# Patient Record
Sex: Male | Born: 1955 | Race: Black or African American | Hispanic: No | Marital: Married | Smoking: Never smoker
Health system: Southern US, Community
[De-identification: ages and names within clinical notes are randomized; demographics above are authoritative.]

## PROBLEM LIST (undated history)

## (undated) DIAGNOSIS — C801 Malignant (primary) neoplasm, unspecified: Secondary | ICD-10-CM

## (undated) DIAGNOSIS — I509 Heart failure, unspecified: Secondary | ICD-10-CM

## (undated) DIAGNOSIS — I2510 Atherosclerotic heart disease of native coronary artery without angina pectoris: Secondary | ICD-10-CM

## (undated) DIAGNOSIS — N289 Disorder of kidney and ureter, unspecified: Secondary | ICD-10-CM

## (undated) DIAGNOSIS — I10 Essential (primary) hypertension: Secondary | ICD-10-CM

## (undated) DIAGNOSIS — I255 Ischemic cardiomyopathy: Secondary | ICD-10-CM

## (undated) HISTORY — PX: HERNIA REPAIR: SHX51

## (undated) HISTORY — PX: PACEMAKER INSERTION: SHX728

---

## 2012-06-01 ENCOUNTER — Encounter: Payer: Medicare Other | Admitting: Internal Medicine

## 2012-06-01 DIAGNOSIS — G893 Neoplasm related pain (acute) (chronic): Secondary | ICD-10-CM

## 2012-06-01 DIAGNOSIS — D649 Anemia, unspecified: Secondary | ICD-10-CM

## 2012-06-01 DIAGNOSIS — C7952 Secondary malignant neoplasm of bone marrow: Secondary | ICD-10-CM

## 2012-06-01 DIAGNOSIS — C61 Malignant neoplasm of prostate: Secondary | ICD-10-CM

## 2012-06-01 DIAGNOSIS — C7951 Secondary malignant neoplasm of bone: Secondary | ICD-10-CM

## 2012-06-28 ENCOUNTER — Encounter: Payer: Medicare Other | Admitting: Internal Medicine

## 2012-06-28 DIAGNOSIS — G893 Neoplasm related pain (acute) (chronic): Secondary | ICD-10-CM

## 2012-06-28 DIAGNOSIS — C61 Malignant neoplasm of prostate: Secondary | ICD-10-CM

## 2012-06-28 DIAGNOSIS — D649 Anemia, unspecified: Secondary | ICD-10-CM

## 2012-06-28 DIAGNOSIS — I82409 Acute embolism and thrombosis of unspecified deep veins of unspecified lower extremity: Secondary | ICD-10-CM

## 2012-07-07 ENCOUNTER — Encounter: Payer: Medicare Other | Admitting: Internal Medicine

## 2012-07-07 DIAGNOSIS — C61 Malignant neoplasm of prostate: Secondary | ICD-10-CM

## 2012-07-07 DIAGNOSIS — G893 Neoplasm related pain (acute) (chronic): Secondary | ICD-10-CM

## 2012-07-07 DIAGNOSIS — Z5111 Encounter for antineoplastic chemotherapy: Secondary | ICD-10-CM

## 2012-07-14 ENCOUNTER — Encounter: Payer: Medicare Other | Admitting: Internal Medicine

## 2012-07-14 DIAGNOSIS — C61 Malignant neoplasm of prostate: Secondary | ICD-10-CM

## 2012-07-14 DIAGNOSIS — D649 Anemia, unspecified: Secondary | ICD-10-CM

## 2012-07-14 DIAGNOSIS — Z5111 Encounter for antineoplastic chemotherapy: Secondary | ICD-10-CM

## 2012-07-21 ENCOUNTER — Encounter: Payer: Medicare Other | Admitting: Internal Medicine

## 2012-07-21 DIAGNOSIS — C61 Malignant neoplasm of prostate: Secondary | ICD-10-CM

## 2012-07-21 DIAGNOSIS — C7952 Secondary malignant neoplasm of bone marrow: Secondary | ICD-10-CM

## 2012-07-21 DIAGNOSIS — C7951 Secondary malignant neoplasm of bone: Secondary | ICD-10-CM

## 2012-07-28 ENCOUNTER — Encounter: Payer: Medicare Other | Admitting: Hematology and Oncology

## 2012-07-28 DIAGNOSIS — C61 Malignant neoplasm of prostate: Secondary | ICD-10-CM

## 2012-07-28 DIAGNOSIS — Z5111 Encounter for antineoplastic chemotherapy: Secondary | ICD-10-CM

## 2012-08-04 DIAGNOSIS — C61 Malignant neoplasm of prostate: Secondary | ICD-10-CM

## 2012-08-04 DIAGNOSIS — C7952 Secondary malignant neoplasm of bone marrow: Secondary | ICD-10-CM

## 2012-08-04 DIAGNOSIS — Z5111 Encounter for antineoplastic chemotherapy: Secondary | ICD-10-CM

## 2012-08-04 DIAGNOSIS — C7951 Secondary malignant neoplasm of bone: Secondary | ICD-10-CM

## 2012-08-11 ENCOUNTER — Encounter: Payer: Medicare Other | Admitting: Internal Medicine

## 2012-08-11 DIAGNOSIS — C7951 Secondary malignant neoplasm of bone: Secondary | ICD-10-CM

## 2012-08-11 DIAGNOSIS — D649 Anemia, unspecified: Secondary | ICD-10-CM

## 2012-08-11 DIAGNOSIS — C61 Malignant neoplasm of prostate: Secondary | ICD-10-CM

## 2012-08-11 DIAGNOSIS — C7952 Secondary malignant neoplasm of bone marrow: Secondary | ICD-10-CM

## 2012-08-24 DIAGNOSIS — D649 Anemia, unspecified: Secondary | ICD-10-CM

## 2012-08-24 DIAGNOSIS — C61 Malignant neoplasm of prostate: Secondary | ICD-10-CM

## 2012-08-24 DIAGNOSIS — Z5111 Encounter for antineoplastic chemotherapy: Secondary | ICD-10-CM

## 2012-09-01 DIAGNOSIS — C7951 Secondary malignant neoplasm of bone: Secondary | ICD-10-CM

## 2012-09-01 DIAGNOSIS — Z5111 Encounter for antineoplastic chemotherapy: Secondary | ICD-10-CM

## 2012-09-01 DIAGNOSIS — C61 Malignant neoplasm of prostate: Secondary | ICD-10-CM

## 2012-09-01 DIAGNOSIS — C7952 Secondary malignant neoplasm of bone marrow: Secondary | ICD-10-CM

## 2012-09-08 ENCOUNTER — Encounter: Payer: Medicare Other | Admitting: Internal Medicine

## 2012-09-08 DIAGNOSIS — D649 Anemia, unspecified: Secondary | ICD-10-CM

## 2012-09-08 DIAGNOSIS — C7952 Secondary malignant neoplasm of bone marrow: Secondary | ICD-10-CM

## 2012-09-08 DIAGNOSIS — C7951 Secondary malignant neoplasm of bone: Secondary | ICD-10-CM

## 2012-09-08 DIAGNOSIS — Z5111 Encounter for antineoplastic chemotherapy: Secondary | ICD-10-CM

## 2012-09-08 DIAGNOSIS — C61 Malignant neoplasm of prostate: Secondary | ICD-10-CM

## 2012-09-15 DIAGNOSIS — C61 Malignant neoplasm of prostate: Secondary | ICD-10-CM

## 2012-09-15 DIAGNOSIS — Z5111 Encounter for antineoplastic chemotherapy: Secondary | ICD-10-CM

## 2012-10-03 ENCOUNTER — Encounter: Payer: Medicare Other | Admitting: Internal Medicine

## 2012-10-03 DIAGNOSIS — C61 Malignant neoplasm of prostate: Secondary | ICD-10-CM

## 2012-10-03 DIAGNOSIS — M549 Dorsalgia, unspecified: Secondary | ICD-10-CM

## 2012-10-03 DIAGNOSIS — Z5111 Encounter for antineoplastic chemotherapy: Secondary | ICD-10-CM

## 2012-11-02 DIAGNOSIS — Z5111 Encounter for antineoplastic chemotherapy: Secondary | ICD-10-CM

## 2012-11-02 DIAGNOSIS — C61 Malignant neoplasm of prostate: Secondary | ICD-10-CM

## 2012-11-16 DIAGNOSIS — C61 Malignant neoplasm of prostate: Secondary | ICD-10-CM

## 2012-11-16 DIAGNOSIS — C779 Secondary and unspecified malignant neoplasm of lymph node, unspecified: Secondary | ICD-10-CM

## 2012-11-16 DIAGNOSIS — C7952 Secondary malignant neoplasm of bone marrow: Secondary | ICD-10-CM

## 2012-11-16 DIAGNOSIS — C7951 Secondary malignant neoplasm of bone: Secondary | ICD-10-CM

## 2012-11-30 DIAGNOSIS — C7951 Secondary malignant neoplasm of bone: Secondary | ICD-10-CM

## 2012-11-30 DIAGNOSIS — C61 Malignant neoplasm of prostate: Secondary | ICD-10-CM

## 2012-11-30 DIAGNOSIS — C7952 Secondary malignant neoplasm of bone marrow: Secondary | ICD-10-CM

## 2012-11-30 DIAGNOSIS — Z5111 Encounter for antineoplastic chemotherapy: Secondary | ICD-10-CM

## 2012-12-16 ENCOUNTER — Encounter: Payer: Medicare Other | Admitting: Internal Medicine

## 2012-12-16 DIAGNOSIS — C61 Malignant neoplasm of prostate: Secondary | ICD-10-CM

## 2012-12-16 DIAGNOSIS — C7952 Secondary malignant neoplasm of bone marrow: Secondary | ICD-10-CM

## 2012-12-16 DIAGNOSIS — C7951 Secondary malignant neoplasm of bone: Secondary | ICD-10-CM

## 2012-12-16 DIAGNOSIS — D649 Anemia, unspecified: Secondary | ICD-10-CM

## 2013-01-11 ENCOUNTER — Encounter: Payer: Medicare Other | Admitting: Internal Medicine

## 2013-01-11 DIAGNOSIS — C7952 Secondary malignant neoplasm of bone marrow: Secondary | ICD-10-CM

## 2013-01-11 DIAGNOSIS — C7951 Secondary malignant neoplasm of bone: Secondary | ICD-10-CM

## 2013-01-11 DIAGNOSIS — D509 Iron deficiency anemia, unspecified: Secondary | ICD-10-CM

## 2013-01-11 DIAGNOSIS — Z5111 Encounter for antineoplastic chemotherapy: Secondary | ICD-10-CM

## 2013-01-11 DIAGNOSIS — C61 Malignant neoplasm of prostate: Secondary | ICD-10-CM

## 2013-01-19 DIAGNOSIS — D509 Iron deficiency anemia, unspecified: Secondary | ICD-10-CM

## 2013-01-19 DIAGNOSIS — E538 Deficiency of other specified B group vitamins: Secondary | ICD-10-CM

## 2013-01-19 DIAGNOSIS — C61 Malignant neoplasm of prostate: Secondary | ICD-10-CM

## 2013-01-26 DIAGNOSIS — C61 Malignant neoplasm of prostate: Secondary | ICD-10-CM

## 2013-01-26 DIAGNOSIS — C7952 Secondary malignant neoplasm of bone marrow: Secondary | ICD-10-CM

## 2013-01-26 DIAGNOSIS — C7951 Secondary malignant neoplasm of bone: Secondary | ICD-10-CM

## 2013-01-26 DIAGNOSIS — D509 Iron deficiency anemia, unspecified: Secondary | ICD-10-CM

## 2013-02-08 DIAGNOSIS — C61 Malignant neoplasm of prostate: Secondary | ICD-10-CM

## 2013-02-08 DIAGNOSIS — C7951 Secondary malignant neoplasm of bone: Secondary | ICD-10-CM

## 2013-02-08 DIAGNOSIS — C779 Secondary and unspecified malignant neoplasm of lymph node, unspecified: Secondary | ICD-10-CM

## 2013-02-08 DIAGNOSIS — C7952 Secondary malignant neoplasm of bone marrow: Secondary | ICD-10-CM

## 2013-02-08 DIAGNOSIS — Z5111 Encounter for antineoplastic chemotherapy: Secondary | ICD-10-CM

## 2013-02-16 DIAGNOSIS — E538 Deficiency of other specified B group vitamins: Secondary | ICD-10-CM

## 2013-02-16 DIAGNOSIS — C61 Malignant neoplasm of prostate: Secondary | ICD-10-CM

## 2013-02-16 DIAGNOSIS — C779 Secondary and unspecified malignant neoplasm of lymph node, unspecified: Secondary | ICD-10-CM

## 2013-03-08 DIAGNOSIS — C7952 Secondary malignant neoplasm of bone marrow: Secondary | ICD-10-CM

## 2013-03-08 DIAGNOSIS — C7951 Secondary malignant neoplasm of bone: Secondary | ICD-10-CM

## 2013-03-08 DIAGNOSIS — Z5111 Encounter for antineoplastic chemotherapy: Secondary | ICD-10-CM

## 2013-03-08 DIAGNOSIS — C61 Malignant neoplasm of prostate: Secondary | ICD-10-CM

## 2013-03-16 DIAGNOSIS — C7952 Secondary malignant neoplasm of bone marrow: Secondary | ICD-10-CM

## 2013-03-16 DIAGNOSIS — E538 Deficiency of other specified B group vitamins: Secondary | ICD-10-CM

## 2013-03-16 DIAGNOSIS — C61 Malignant neoplasm of prostate: Secondary | ICD-10-CM

## 2013-03-16 DIAGNOSIS — C7951 Secondary malignant neoplasm of bone: Secondary | ICD-10-CM

## 2013-03-27 DIAGNOSIS — Z5111 Encounter for antineoplastic chemotherapy: Secondary | ICD-10-CM

## 2013-03-27 DIAGNOSIS — C7952 Secondary malignant neoplasm of bone marrow: Secondary | ICD-10-CM

## 2013-03-27 DIAGNOSIS — C61 Malignant neoplasm of prostate: Secondary | ICD-10-CM

## 2013-03-27 DIAGNOSIS — C7951 Secondary malignant neoplasm of bone: Secondary | ICD-10-CM

## 2013-04-05 DIAGNOSIS — C7951 Secondary malignant neoplasm of bone: Secondary | ICD-10-CM

## 2013-04-05 DIAGNOSIS — Z5111 Encounter for antineoplastic chemotherapy: Secondary | ICD-10-CM

## 2013-04-05 DIAGNOSIS — C779 Secondary and unspecified malignant neoplasm of lymph node, unspecified: Secondary | ICD-10-CM

## 2013-04-05 DIAGNOSIS — C7952 Secondary malignant neoplasm of bone marrow: Secondary | ICD-10-CM

## 2013-04-05 DIAGNOSIS — C61 Malignant neoplasm of prostate: Secondary | ICD-10-CM

## 2013-04-12 DIAGNOSIS — E538 Deficiency of other specified B group vitamins: Secondary | ICD-10-CM

## 2013-04-12 DIAGNOSIS — C7952 Secondary malignant neoplasm of bone marrow: Secondary | ICD-10-CM

## 2013-04-12 DIAGNOSIS — C61 Malignant neoplasm of prostate: Secondary | ICD-10-CM

## 2013-04-12 DIAGNOSIS — Z5111 Encounter for antineoplastic chemotherapy: Secondary | ICD-10-CM

## 2013-04-12 DIAGNOSIS — C7951 Secondary malignant neoplasm of bone: Secondary | ICD-10-CM

## 2013-04-13 ENCOUNTER — Encounter: Payer: Self-pay | Admitting: Internal Medicine

## 2013-04-19 DIAGNOSIS — C7951 Secondary malignant neoplasm of bone: Secondary | ICD-10-CM

## 2013-04-19 DIAGNOSIS — Z5111 Encounter for antineoplastic chemotherapy: Secondary | ICD-10-CM

## 2013-04-19 DIAGNOSIS — C7952 Secondary malignant neoplasm of bone marrow: Secondary | ICD-10-CM

## 2013-04-19 DIAGNOSIS — C61 Malignant neoplasm of prostate: Secondary | ICD-10-CM

## 2013-04-26 DIAGNOSIS — Z5111 Encounter for antineoplastic chemotherapy: Secondary | ICD-10-CM

## 2013-04-26 DIAGNOSIS — C7951 Secondary malignant neoplasm of bone: Secondary | ICD-10-CM

## 2013-04-26 DIAGNOSIS — C61 Malignant neoplasm of prostate: Secondary | ICD-10-CM

## 2013-04-26 DIAGNOSIS — C7952 Secondary malignant neoplasm of bone marrow: Secondary | ICD-10-CM

## 2013-05-03 DIAGNOSIS — C7951 Secondary malignant neoplasm of bone: Secondary | ICD-10-CM

## 2013-05-03 DIAGNOSIS — Z5111 Encounter for antineoplastic chemotherapy: Secondary | ICD-10-CM

## 2013-05-03 DIAGNOSIS — C61 Malignant neoplasm of prostate: Secondary | ICD-10-CM

## 2013-05-03 DIAGNOSIS — C7952 Secondary malignant neoplasm of bone marrow: Secondary | ICD-10-CM

## 2013-05-11 DIAGNOSIS — C7952 Secondary malignant neoplasm of bone marrow: Secondary | ICD-10-CM

## 2013-05-11 DIAGNOSIS — C7951 Secondary malignant neoplasm of bone: Secondary | ICD-10-CM

## 2013-05-11 DIAGNOSIS — C61 Malignant neoplasm of prostate: Secondary | ICD-10-CM

## 2013-05-11 DIAGNOSIS — E538 Deficiency of other specified B group vitamins: Secondary | ICD-10-CM

## 2013-05-15 ENCOUNTER — Encounter: Payer: Self-pay | Admitting: Internal Medicine

## 2013-05-15 DIAGNOSIS — C772 Secondary and unspecified malignant neoplasm of intra-abdominal lymph nodes: Secondary | ICD-10-CM

## 2013-05-15 DIAGNOSIS — C7952 Secondary malignant neoplasm of bone marrow: Secondary | ICD-10-CM

## 2013-05-15 DIAGNOSIS — C61 Malignant neoplasm of prostate: Secondary | ICD-10-CM

## 2013-05-15 DIAGNOSIS — C7951 Secondary malignant neoplasm of bone: Secondary | ICD-10-CM

## 2013-05-31 DIAGNOSIS — C7952 Secondary malignant neoplasm of bone marrow: Secondary | ICD-10-CM

## 2013-05-31 DIAGNOSIS — Z5111 Encounter for antineoplastic chemotherapy: Secondary | ICD-10-CM

## 2013-05-31 DIAGNOSIS — C7951 Secondary malignant neoplasm of bone: Secondary | ICD-10-CM

## 2013-05-31 DIAGNOSIS — C61 Malignant neoplasm of prostate: Secondary | ICD-10-CM

## 2013-06-05 ENCOUNTER — Encounter (HOSPITAL_COMMUNITY): Payer: Self-pay | Admitting: Emergency Medicine

## 2013-06-05 ENCOUNTER — Emergency Department (HOSPITAL_COMMUNITY)
Admission: EM | Admit: 2013-06-05 | Discharge: 2013-06-05 | Disposition: A | Discharge status: Home / Self Care | Payer: Medicaid - Out of State | Attending: Emergency Medicine | Admitting: Emergency Medicine

## 2013-06-05 DIAGNOSIS — I10 Essential (primary) hypertension: Secondary | ICD-10-CM | POA: Insufficient documentation

## 2013-06-05 DIAGNOSIS — Z79899 Other long term (current) drug therapy: Secondary | ICD-10-CM | POA: Insufficient documentation

## 2013-06-05 DIAGNOSIS — R609 Edema, unspecified: Secondary | ICD-10-CM | POA: Insufficient documentation

## 2013-06-05 DIAGNOSIS — R300 Dysuria: Secondary | ICD-10-CM | POA: Insufficient documentation

## 2013-06-05 DIAGNOSIS — Z7982 Long term (current) use of aspirin: Secondary | ICD-10-CM | POA: Insufficient documentation

## 2013-06-05 DIAGNOSIS — N4889 Other specified disorders of penis: Secondary | ICD-10-CM

## 2013-06-05 DIAGNOSIS — Z859 Personal history of malignant neoplasm, unspecified: Secondary | ICD-10-CM | POA: Insufficient documentation

## 2013-06-05 HISTORY — DX: Essential (primary) hypertension: I10

## 2013-06-05 HISTORY — DX: Malignant (primary) neoplasm, unspecified: C80.1

## 2013-06-05 LAB — COMPREHENSIVE METABOLIC PANEL
BUN: 16 mg/dL (ref 6–23)
CO2: 27 mEq/L (ref 19–32)
Calcium: 9.1 mg/dL (ref 8.4–10.5)
Chloride: 107 mEq/L (ref 96–112)
Creatinine, Ser: 0.8 mg/dL (ref 0.50–1.35)
GFR calc non Af Amer: >90 mL/min (ref >90)
Total Bilirubin: 0.6 mg/dL (ref 0.3–1.2)

## 2013-06-05 LAB — CBC WITH DIFFERENTIAL/PLATELET
Basophils Relative: 0 % (ref 0–1)
Eosinophils Relative: 3 % (ref 0–5)
HCT: 34.6 % — ABNORMAL LOW (ref 39.0–52.0)
Hemoglobin: 10.9 g/dL — ABNORMAL LOW (ref 13.0–17.0)
MCHC: 31.5 g/dL (ref 30.0–36.0)
MCV: 84.8 fL (ref 78.0–100.0)
Monocytes Absolute: 0.5 10*3/uL (ref 0.1–1.0)
Monocytes Relative: 11 % (ref 3–12)
Neutro Abs: 2.8 10*3/uL (ref 1.7–7.7)

## 2013-06-05 LAB — URINALYSIS, ROUTINE W REFLEX MICROSCOPIC
Leukocytes, UA: NEGATIVE
Nitrite: NEGATIVE
Protein, ur: NEGATIVE mg/dL
Urobilinogen, UA: 0.2 mg/dL (ref 0.0–1.0)

## 2013-06-05 NOTE — ED Notes (Signed)
Pt c/o swelling in legs and scrotum x several days; pt sts hx of same

## 2013-06-05 NOTE — Progress Notes (Signed)
*  Preliminary Results* Left lower extremity venous duplex completed. Left lower extremity is negative for deep vein thrombosis. There is no evidence of left Baker's cyst. Preliminary results discussed with Dr.Pickering.  06/05/2013 3:55 PM Gertie Fey, RDMS, RDCS

## 2013-06-05 NOTE — ED Provider Notes (Signed)
History     CSN: [id number]  Arrival date & time 06/05/13  1244   First MD Initiated Contact with Patient 06/05/13 1352      Chief Complaint  Patient presents with  . Leg Swelling    (Consider location/radiation/quality/duration/timing/severity/associated sxs/prior treatment) The history is provided by the patient.   patient states he has had swelling of his left thigh for the last 3 weeks and his penis for the last 2 weeks. He states he saw the hospital in [place] and they told nothing was wrong. He states he has been having difficulty urinating. He states that his left thigh is swollen. He denies difficulty breathing or chest pain. No fevers. No trauma. He states he has cancer and is on treatment for it in again. He states he has "bone cancer"  Past Medical History  Diagnosis Date  . Cancer   . Hypertension     History reviewed. No pertinent past surgical history.  History reviewed. No pertinent family history.  History  Substance Use Topics  . Smoking status: Never Smoker   . Smokeless tobacco: Not on file  . Alcohol Use: No      Review of Systems  Constitutional: Negative for activity change and appetite change.  HENT: Negative for neck stiffness.   Eyes: Negative for pain.  Respiratory: Negative for chest tightness and shortness of breath.   Cardiovascular: Positive for leg swelling. Negative for chest pain.  Gastrointestinal: Negative for nausea, vomiting, abdominal pain and diarrhea.  Genitourinary: Negative for flank pain.  Musculoskeletal: Negative for back pain.  Skin: Negative for rash.  Neurological: Negative for weakness, numbness and headaches.  Psychiatric/Behavioral: Negative for behavioral problems.    Allergies  Review of patient's allergies indicates no known allergies.  Home Medications   Current Outpatient Rx  Name  Route  Sig  Dispense  Refill  . aspirin EC 81 MG tablet   Oral   Take 81 mg by mouth daily.         . Calcium  Carbonate-Vitamin D (CALCIUM + D PO)   Oral   Take 1 tablet by mouth daily. 600 mg/ 800 units         . fentaNYL (DURAGESIC - DOSED MCG/HR) 100 MCG/HR   Transdermal   Place 1 patch onto the skin every 3 (three) days.         . furosemide (LASIX) 40 MG tablet   Oral   Take 40 mg by mouth daily.         Marland Kitchen lisinopril (PRINIVIL,ZESTRIL) 5 MG tablet   Oral   Take 5 mg by mouth daily.         Marland Kitchen LORazepam (ATIVAN) 2 MG tablet   Oral   Take 2 mg by mouth at bedtime as needed for anxiety (and sleep).         . metoprolol (LOPRESSOR) 50 MG tablet   Oral   Take 50 mg by mouth 2 (two) times daily.         . Oxycodone HCl 10 MG TABS   Oral   Take 20 mg by mouth every 3 (three) hours as needed (for pain).         . potassium chloride (MICRO-K) 10 MEQ CR capsule   Oral   Take 10 mEq by mouth daily.         . predniSONE (DELTASONE) 10 MG tablet   Oral   Take 10 mg by mouth daily.         Marland Kitchen  senna-docusate (SENOKOT-S) 8.6-50 MG per tablet   Oral   Take 1 tablet by mouth daily as needed for constipation.         . simvastatin (ZOCOR) 40 MG tablet   Oral   Take 40 mg by mouth every evening.         . traZODone (DESYREL) 50 MG tablet   Oral   Take 50 mg by mouth at bedtime as needed for sleep.           BP 142/79  Pulse 66  Temp(Src) 98 F (36.7 C) (Oral)  Resp 19  SpO2 99%  Physical Exam  Nursing note and vitals reviewed. Constitutional: He is oriented to person, place, and time. He appears well-developed and well-nourished.  HENT:  Head: Normocephalic and atraumatic.  Eyes: EOM are normal. Pupils are equal, round, and reactive to light.  Neck: Normal range of motion. Neck supple.  Cardiovascular: Normal rate, regular rhythm and normal heart sounds.   No murmur heard. Pulmonary/Chest: Effort normal and breath sounds normal.  Abdominal: Soft. Bowel sounds are normal. He exhibits no distension and no mass. There is no tenderness. There is no  rebound and no guarding.  Genitourinary:  Patient with edema of the head and shaft of his penis. No erethema.  Musculoskeletal: Normal range of motion. He exhibits edema.  Mild peripheral edema. Some swelling of left thigh.  Neurological: He is alert and oriented to person, place, and time. No cranial nerve deficit.  Skin: Skin is warm and dry.  Psychiatric: He has a normal mood and affect.    ED Course  Procedures (including critical care time)  Labs Reviewed  CBC WITH DIFFERENTIAL - Abnormal; Notable for the following:    RBC 4.08 (*)    Hemoglobin 10.9 (*)    HCT 34.6 (*)    RDW 16.9 (*)    All other components within normal limits  COMPREHENSIVE METABOLIC PANEL - Abnormal; Notable for the following:    Glucose, Bld 104 (*)    All other components within normal limits  URINALYSIS, ROUTINE W REFLEX MICROSCOPIC   No results found.   1. Edema of penis       MDM  Patient with edema penis and possible swelling of left thigh. Lab work is reassuring and negative Doppler. Patient won't be given extra dose of Lasix at home for the next 2 days. He'll follow with urology. He does not appear to be infected. He has been able to urinate        [employer]. Rubin Payor, MD 06/05/13 1616

## 2013-06-12 ENCOUNTER — Encounter: Payer: Medicaid - Out of State | Admitting: Internal Medicine

## 2013-06-12 DIAGNOSIS — C7952 Secondary malignant neoplasm of bone marrow: Secondary | ICD-10-CM

## 2013-06-12 DIAGNOSIS — E538 Deficiency of other specified B group vitamins: Secondary | ICD-10-CM

## 2013-06-12 DIAGNOSIS — C61 Malignant neoplasm of prostate: Secondary | ICD-10-CM

## 2013-06-12 DIAGNOSIS — C772 Secondary and unspecified malignant neoplasm of intra-abdominal lymph nodes: Secondary | ICD-10-CM

## 2013-06-12 DIAGNOSIS — C7951 Secondary malignant neoplasm of bone: Secondary | ICD-10-CM

## 2013-06-29 DIAGNOSIS — C61 Malignant neoplasm of prostate: Secondary | ICD-10-CM

## 2013-06-29 DIAGNOSIS — C7951 Secondary malignant neoplasm of bone: Secondary | ICD-10-CM

## 2013-06-29 DIAGNOSIS — Z5111 Encounter for antineoplastic chemotherapy: Secondary | ICD-10-CM

## 2013-06-29 DIAGNOSIS — C7952 Secondary malignant neoplasm of bone marrow: Secondary | ICD-10-CM

## 2013-07-10 DIAGNOSIS — C61 Malignant neoplasm of prostate: Secondary | ICD-10-CM

## 2013-07-10 DIAGNOSIS — C7951 Secondary malignant neoplasm of bone: Secondary | ICD-10-CM

## 2013-07-10 DIAGNOSIS — C7952 Secondary malignant neoplasm of bone marrow: Secondary | ICD-10-CM

## 2013-08-07 DIAGNOSIS — C61 Malignant neoplasm of prostate: Secondary | ICD-10-CM

## 2013-08-07 DIAGNOSIS — C7952 Secondary malignant neoplasm of bone marrow: Secondary | ICD-10-CM

## 2013-08-07 DIAGNOSIS — C7951 Secondary malignant neoplasm of bone: Secondary | ICD-10-CM

## 2013-08-14 DIAGNOSIS — R109 Unspecified abdominal pain: Secondary | ICD-10-CM

## 2013-08-21 DIAGNOSIS — C61 Malignant neoplasm of prostate: Secondary | ICD-10-CM

## 2013-08-21 DIAGNOSIS — C7952 Secondary malignant neoplasm of bone marrow: Secondary | ICD-10-CM

## 2013-08-21 DIAGNOSIS — F29 Unspecified psychosis not due to a substance or known physiological condition: Secondary | ICD-10-CM

## 2013-08-21 DIAGNOSIS — Z5111 Encounter for antineoplastic chemotherapy: Secondary | ICD-10-CM

## 2013-08-21 DIAGNOSIS — C7951 Secondary malignant neoplasm of bone: Secondary | ICD-10-CM

## 2013-08-21 DIAGNOSIS — R509 Fever, unspecified: Secondary | ICD-10-CM

## 2013-08-25 DIAGNOSIS — D649 Anemia, unspecified: Secondary | ICD-10-CM

## 2013-08-25 DIAGNOSIS — C61 Malignant neoplasm of prostate: Secondary | ICD-10-CM

## 2013-09-01 DIAGNOSIS — Z5111 Encounter for antineoplastic chemotherapy: Secondary | ICD-10-CM

## 2013-09-01 DIAGNOSIS — C61 Malignant neoplasm of prostate: Secondary | ICD-10-CM

## 2013-09-01 DIAGNOSIS — D649 Anemia, unspecified: Secondary | ICD-10-CM

## 2013-09-01 DIAGNOSIS — E538 Deficiency of other specified B group vitamins: Secondary | ICD-10-CM

## 2013-09-04 DIAGNOSIS — C7951 Secondary malignant neoplasm of bone: Secondary | ICD-10-CM

## 2013-09-04 DIAGNOSIS — C7952 Secondary malignant neoplasm of bone marrow: Secondary | ICD-10-CM

## 2013-09-04 DIAGNOSIS — C61 Malignant neoplasm of prostate: Secondary | ICD-10-CM

## 2013-09-04 DIAGNOSIS — D702 Other drug-induced agranulocytosis: Secondary | ICD-10-CM

## 2013-09-08 DIAGNOSIS — C61 Malignant neoplasm of prostate: Secondary | ICD-10-CM

## 2013-09-08 DIAGNOSIS — D649 Anemia, unspecified: Secondary | ICD-10-CM

## 2013-09-14 DIAGNOSIS — D630 Anemia in neoplastic disease: Secondary | ICD-10-CM

## 2013-09-14 DIAGNOSIS — C787 Secondary malignant neoplasm of liver and intrahepatic bile duct: Secondary | ICD-10-CM

## 2013-09-14 DIAGNOSIS — C7952 Secondary malignant neoplasm of bone marrow: Secondary | ICD-10-CM

## 2013-09-14 DIAGNOSIS — C61 Malignant neoplasm of prostate: Secondary | ICD-10-CM

## 2013-09-14 DIAGNOSIS — C7951 Secondary malignant neoplasm of bone: Secondary | ICD-10-CM

## 2013-09-21 DIAGNOSIS — Z5111 Encounter for antineoplastic chemotherapy: Secondary | ICD-10-CM

## 2013-09-21 DIAGNOSIS — D649 Anemia, unspecified: Secondary | ICD-10-CM

## 2013-09-21 DIAGNOSIS — C7952 Secondary malignant neoplasm of bone marrow: Secondary | ICD-10-CM

## 2013-09-21 DIAGNOSIS — C7951 Secondary malignant neoplasm of bone: Secondary | ICD-10-CM

## 2013-09-21 DIAGNOSIS — C61 Malignant neoplasm of prostate: Secondary | ICD-10-CM

## 2013-09-22 DIAGNOSIS — T451X5A Adverse effect of antineoplastic and immunosuppressive drugs, initial encounter: Secondary | ICD-10-CM

## 2013-09-22 DIAGNOSIS — D702 Other drug-induced agranulocytosis: Secondary | ICD-10-CM

## 2013-09-28 DIAGNOSIS — D6481 Anemia due to antineoplastic chemotherapy: Secondary | ICD-10-CM

## 2013-09-28 DIAGNOSIS — T451X5A Adverse effect of antineoplastic and immunosuppressive drugs, initial encounter: Secondary | ICD-10-CM

## 2013-10-05 DIAGNOSIS — D649 Anemia, unspecified: Secondary | ICD-10-CM

## 2013-10-12 DIAGNOSIS — C7952 Secondary malignant neoplasm of bone marrow: Secondary | ICD-10-CM

## 2013-10-12 DIAGNOSIS — Z5111 Encounter for antineoplastic chemotherapy: Secondary | ICD-10-CM

## 2013-10-12 DIAGNOSIS — C7951 Secondary malignant neoplasm of bone: Secondary | ICD-10-CM

## 2013-10-12 DIAGNOSIS — C61 Malignant neoplasm of prostate: Secondary | ICD-10-CM

## 2013-10-12 DIAGNOSIS — D649 Anemia, unspecified: Secondary | ICD-10-CM

## 2013-10-12 DIAGNOSIS — C787 Secondary malignant neoplasm of liver and intrahepatic bile duct: Secondary | ICD-10-CM

## 2013-11-02 DIAGNOSIS — Z5111 Encounter for antineoplastic chemotherapy: Secondary | ICD-10-CM

## 2013-11-02 DIAGNOSIS — C7951 Secondary malignant neoplasm of bone: Secondary | ICD-10-CM

## 2013-11-02 DIAGNOSIS — C7952 Secondary malignant neoplasm of bone marrow: Secondary | ICD-10-CM

## 2013-11-02 DIAGNOSIS — C61 Malignant neoplasm of prostate: Secondary | ICD-10-CM

## 2013-11-02 DIAGNOSIS — C787 Secondary malignant neoplasm of liver and intrahepatic bile duct: Secondary | ICD-10-CM

## 2013-11-09 DIAGNOSIS — R609 Edema, unspecified: Secondary | ICD-10-CM

## 2013-11-09 DIAGNOSIS — C787 Secondary malignant neoplasm of liver and intrahepatic bile duct: Secondary | ICD-10-CM

## 2013-11-09 DIAGNOSIS — R339 Retention of urine, unspecified: Secondary | ICD-10-CM

## 2013-11-09 DIAGNOSIS — C7951 Secondary malignant neoplasm of bone: Secondary | ICD-10-CM

## 2013-11-09 DIAGNOSIS — C61 Malignant neoplasm of prostate: Secondary | ICD-10-CM

## 2013-11-09 DIAGNOSIS — C7952 Secondary malignant neoplasm of bone marrow: Secondary | ICD-10-CM

## 2013-11-21 DIAGNOSIS — C61 Malignant neoplasm of prostate: Secondary | ICD-10-CM

## 2013-11-21 DIAGNOSIS — C7952 Secondary malignant neoplasm of bone marrow: Secondary | ICD-10-CM

## 2013-11-21 DIAGNOSIS — C7951 Secondary malignant neoplasm of bone: Secondary | ICD-10-CM

## 2013-11-21 DIAGNOSIS — Z5111 Encounter for antineoplastic chemotherapy: Secondary | ICD-10-CM

## 2013-11-22 DIAGNOSIS — D702 Other drug-induced agranulocytosis: Secondary | ICD-10-CM

## 2014-04-23 ENCOUNTER — Other Ambulatory Visit: Payer: Self-pay | Admitting: *Deleted

## 2014-04-23 DIAGNOSIS — R609 Edema, unspecified: Secondary | ICD-10-CM

## 2014-05-09 ENCOUNTER — Encounter: Payer: Self-pay | Admitting: Vascular Surgery

## 2014-05-10 ENCOUNTER — Encounter: Payer: Self-pay | Admitting: Vascular Surgery

## 2014-05-10 ENCOUNTER — Ambulatory Visit (INDEPENDENT_AMBULATORY_CARE_PROVIDER_SITE_OTHER): Payer: Medicare Other | Admitting: Vascular Surgery

## 2014-05-10 ENCOUNTER — Ambulatory Visit (HOSPITAL_COMMUNITY)
Admission: RE | Admit: 2014-05-10 | Discharge: 2014-05-10 | Disposition: A | Discharge status: Home / Self Care | Payer: Medicare Other | Source: Ambulatory Visit | Attending: Vascular Surgery | Admitting: Vascular Surgery

## 2014-05-10 VITALS — BP 109/76 | HR 84 | Resp 16 | Ht 70.0 in | Wt 217.0 lb

## 2014-05-10 DIAGNOSIS — R609 Edema, unspecified: Secondary | ICD-10-CM | POA: Diagnosis not present

## 2014-05-10 DIAGNOSIS — M25569 Pain in unspecified knee: Secondary | ICD-10-CM | POA: Diagnosis not present

## 2014-05-10 NOTE — Progress Notes (Signed)
[facility] [place] HISTORY AND PHYSICAL   History of Present Illness:  Patient is a 58 y.o. year old male who presents for evaluation of bilateral lower extremity edema left leg greater than right. The patient has known prostate cancer. 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 He has had persistent swelling of both lower extremities for approximately 8 months. The left leg is worse than the right. He has also had edema of the penis. He was sent here for further evaluation of the swelling. Review of his medical records is remarkable for a CT scan performed November of 2014 which shows diffuse hepatic metastatic deposits with multiple small retroperitoneal lymph nodes in presacral edema.  The patient denies any trauma to his lower extremities. He denies prior history of DVT. To review his medical record she has had multiple prior venous duplex scans which showed no evidence of DVT. Other medical problems include hypertension which is controlled.  Past Medical History  Diagnosis Date  . Cancer   . Hypertension     Past Surgical History  Procedure Laterality Date  . Hernia repair    . Pacemaker insertion      Social History History  Substance Use Topics  . Smoking status: Never Smoker   . Smokeless tobacco: Never Used  . Alcohol Use: No    Family History Family History  Problem Relation Age of Onset  . Heart disease Mother     Allergies  No Known Allergies   Current Outpatient Prescriptions  Medication Sig Dispense Refill  . aspirin EC 81 MG tablet Take 81 mg by mouth daily.      . Calcium Carbonate-Vitamin D (CALCIUM + D PO) Take 1 tablet by mouth daily. 600 mg/ 800 units      . fentaNYL (DURAGESIC - DOSED MCG/HR) 100 MCG/HR Place 1 patch onto the skin every 3 (three) days.      . furosemide (LASIX) 40 MG tablet Take 40 mg by mouth daily.      Marland Kitchen lisinopril (PRINIVIL,ZESTRIL) 5 MG tablet Take 5 mg by mouth daily.      Marland Kitchen LORazepam (ATIVAN) 2 MG  tablet Take 2 mg by mouth at bedtime as needed for anxiety (and sleep).      . metoprolol (LOPRESSOR) 50 MG tablet Take 50 mg by mouth 2 (two) times daily.      . Oxycodone HCl 10 MG TABS Take 20 mg by mouth every 3 (three) hours as needed (for pain).      . potassium chloride (MICRO-K) 10 MEQ CR capsule Take 10 mEq by mouth daily.      Marland Kitchen senna-docusate (SENOKOT-S) 8.6-50 MG per tablet Take 1 tablet by mouth daily as needed for constipation.      . simvastatin (ZOCOR) 40 MG tablet Take 40 mg by mouth every evening.      . traZODone (DESYREL) 50 MG tablet Take 50 mg by mouth at bedtime as needed for sleep.      . predniSONE (DELTASONE) 10 MG tablet Take 10 mg by mouth daily.       No current facility-administered medications for this visit.    ROS:   General:  No weight loss, Fever, chills  HEENT: No recent headaches, no nasal bleeding, no visual changes, no sore throat  Neurologic: No dizziness, blackouts, seizures. No recent symptoms of stroke or mini- stroke. No recent episodes of slurred speech, or temporary blindness.  Cardiac: No recent episodes of chest pain/pressure, no  shortness of breath at rest.  No shortness of breath with exertion.  Denies history of atrial fibrillation or irregular heartbeat  Vascular: No history of rest pain in feet.  No history of claudication.  No history of non-healing ulcer, No history of DVT   Pulmonary: No home oxygen, no productive cough, no hemoptysis,  No asthma or wheezing  Musculoskeletal:  [ ]  Arthritis, [ ]  Low back pain,  [ ]  Joint pain  Hematologic:No history of hypercoagulable state.  No history of easy bleeding.  No history of anemia  Gastrointestinal: No hematochezia or melena,  No gastroesophageal reflux, no trouble swallowing  Urinary: [ ]  chronic Kidney disease, [ ]  on HD - [ ]  MWF or [ ]  TTHS, [ ]  Burning with urination, [ ]  Frequent urination, [ ]  Difficulty urinating;   Skin: No rashes  Psychological: No history of anxiety,   No history of depression   Physical Examination  Filed Vitals:   05/10/14 1538  BP: 109/76  Pulse: 84  Resp: 16  Height: 5\' 10"  (1.778 m)  Weight: 217 lb (98.431 kg)    Body mass index is 31.14 kg/(m^2).  General:  Alert and oriented, no acute distress HEENT: Normal Neck: No bruit or JVD Pulmonary: Clear to auscultation bilaterally Cardiac: Regular Rate and Rhythm without murmur Abdomen: Soft, non-tender, non-distended, no mass, bilateral inguinal scars from prior hernia repair no abdominal wall edema  Skin: No rash Extremity Pulses:  2+ radial, brachial, femoral, absent dorsalis pedis, posterior tibial pulses bilaterally Musculoskeletal: No deformity, diffuse thickened woody edema left lower extremity extending from the thigh to the foot similar findings right leg although not as prominent no obvious varicosities  Neurologic: Upper and lower extremity motor 5/5 and symmetric  DATA:  Patient had a venous duplex exam today which showed no evidence of DVT, no superficial venous thrombosis and no evidence of venous reflux   ASSESSMENT:  Bilateral lower extremity lymphedema left leg greater than right most likely secondary to lymphatic obstruction from his prostate cancer   PLAN:  The patient previously had worn a panty hose style compression garment. I have encouraged him to start wearing this again to improve his symptoms. If his symptoms worsen over time he could be referred to physical therapy for instruction and manual compression of his lymphedema. He will followup on as-needed basis.  Ruta Hinds, MD [facility] [place] Office: [phone] Pager: [phone]

## 2014-09-24 ENCOUNTER — Inpatient Hospital Stay (HOSPITAL_COMMUNITY)
Admission: AD | Admit: 2014-09-24 | Discharge: 2014-10-28 | DRG: 286 | Disposition: E | Payer: Medicare Other | Source: Other Acute Inpatient Hospital | Attending: Internal Medicine | Admitting: Internal Medicine

## 2014-09-24 ENCOUNTER — Encounter (HOSPITAL_COMMUNITY): Payer: Self-pay | Admitting: Physician Assistant

## 2014-09-24 ENCOUNTER — Inpatient Hospital Stay (HOSPITAL_COMMUNITY): Payer: Medicare Other

## 2014-09-24 DIAGNOSIS — C7B8 Other secondary neuroendocrine tumors: Secondary | ICD-10-CM | POA: Diagnosis not present

## 2014-09-24 DIAGNOSIS — E874 Mixed disorder of acid-base balance: Secondary | ICD-10-CM | POA: Diagnosis present

## 2014-09-24 DIAGNOSIS — J9600 Acute respiratory failure, unspecified whether with hypoxia or hypercapnia: Secondary | ICD-10-CM | POA: Diagnosis not present

## 2014-09-24 DIAGNOSIS — E875 Hyperkalemia: Secondary | ICD-10-CM | POA: Diagnosis not present

## 2014-09-24 DIAGNOSIS — C7951 Secondary malignant neoplasm of bone: Secondary | ICD-10-CM | POA: Diagnosis present

## 2014-09-24 DIAGNOSIS — R34 Anuria and oliguria: Secondary | ICD-10-CM | POA: Diagnosis not present

## 2014-09-24 DIAGNOSIS — Z95810 Presence of automatic (implantable) cardiac defibrillator: Secondary | ICD-10-CM

## 2014-09-24 DIAGNOSIS — IMO0002 Reserved for concepts with insufficient information to code with codable children: Secondary | ICD-10-CM | POA: Diagnosis not present

## 2014-09-24 DIAGNOSIS — N390 Urinary tract infection, site not specified: Secondary | ICD-10-CM | POA: Diagnosis not present

## 2014-09-24 DIAGNOSIS — I4721 Torsades de pointes: Secondary | ICD-10-CM

## 2014-09-24 DIAGNOSIS — Z8546 Personal history of malignant neoplasm of prostate: Secondary | ICD-10-CM | POA: Diagnosis not present

## 2014-09-24 DIAGNOSIS — I129 Hypertensive chronic kidney disease with stage 1 through stage 4 chronic kidney disease, or unspecified chronic kidney disease: Secondary | ICD-10-CM | POA: Diagnosis not present

## 2014-09-24 DIAGNOSIS — I2589 Other forms of chronic ischemic heart disease: Secondary | ICD-10-CM

## 2014-09-24 DIAGNOSIS — N189 Chronic kidney disease, unspecified: Secondary | ICD-10-CM | POA: Diagnosis present

## 2014-09-24 DIAGNOSIS — D638 Anemia in other chronic diseases classified elsewhere: Secondary | ICD-10-CM | POA: Diagnosis not present

## 2014-09-24 DIAGNOSIS — I2510 Atherosclerotic heart disease of native coronary artery without angina pectoris: Secondary | ICD-10-CM | POA: Diagnosis not present

## 2014-09-24 DIAGNOSIS — J9811 Atelectasis: Secondary | ICD-10-CM | POA: Diagnosis not present

## 2014-09-24 DIAGNOSIS — Z66 Do not resuscitate: Secondary | ICD-10-CM | POA: Diagnosis not present

## 2014-09-24 DIAGNOSIS — J9819 Other pulmonary collapse: Secondary | ICD-10-CM | POA: Diagnosis not present

## 2014-09-24 DIAGNOSIS — R570 Cardiogenic shock: Secondary | ICD-10-CM | POA: Diagnosis present

## 2014-09-24 DIAGNOSIS — Z7952 Long term (current) use of systemic steroids: Secondary | ICD-10-CM

## 2014-09-24 DIAGNOSIS — K5900 Constipation, unspecified: Secondary | ICD-10-CM | POA: Diagnosis not present

## 2014-09-24 DIAGNOSIS — I4729 Other ventricular tachycardia: Secondary | ICD-10-CM | POA: Diagnosis present

## 2014-09-24 DIAGNOSIS — Q231 Congenital insufficiency of aortic valve: Secondary | ICD-10-CM

## 2014-09-24 DIAGNOSIS — Z7982 Long term (current) use of aspirin: Secondary | ICD-10-CM | POA: Diagnosis not present

## 2014-09-24 DIAGNOSIS — R739 Hyperglycemia, unspecified: Secondary | ICD-10-CM | POA: Diagnosis not present

## 2014-09-24 DIAGNOSIS — I252 Old myocardial infarction: Secondary | ICD-10-CM | POA: Diagnosis not present

## 2014-09-24 DIAGNOSIS — Z8249 Family history of ischemic heart disease and other diseases of the circulatory system: Secondary | ICD-10-CM

## 2014-09-24 DIAGNOSIS — I5023 Acute on chronic systolic (congestive) heart failure: Secondary | ICD-10-CM

## 2014-09-24 DIAGNOSIS — E876 Hypokalemia: Secondary | ICD-10-CM | POA: Diagnosis present

## 2014-09-24 DIAGNOSIS — Z515 Encounter for palliative care: Secondary | ICD-10-CM | POA: Diagnosis not present

## 2014-09-24 DIAGNOSIS — I509 Heart failure, unspecified: Secondary | ICD-10-CM | POA: Diagnosis not present

## 2014-09-24 DIAGNOSIS — N179 Acute kidney failure, unspecified: Secondary | ICD-10-CM | POA: Diagnosis present

## 2014-09-24 DIAGNOSIS — K567 Ileus, unspecified: Secondary | ICD-10-CM

## 2014-09-24 DIAGNOSIS — I472 Ventricular tachycardia: Principal | ICD-10-CM | POA: Diagnosis present

## 2014-09-24 DIAGNOSIS — Z23 Encounter for immunization: Secondary | ICD-10-CM

## 2014-09-24 DIAGNOSIS — I5043 Acute on chronic combined systolic (congestive) and diastolic (congestive) heart failure: Secondary | ICD-10-CM | POA: Diagnosis present

## 2014-09-24 DIAGNOSIS — K7200 Acute and subacute hepatic failure without coma: Secondary | ICD-10-CM | POA: Diagnosis not present

## 2014-09-24 DIAGNOSIS — E871 Hypo-osmolality and hyponatremia: Secondary | ICD-10-CM | POA: Diagnosis not present

## 2014-09-24 DIAGNOSIS — R0600 Dyspnea, unspecified: Secondary | ICD-10-CM

## 2014-09-24 DIAGNOSIS — R7309 Other abnormal glucose: Secondary | ICD-10-CM | POA: Diagnosis not present

## 2014-09-24 DIAGNOSIS — I255 Ischemic cardiomyopathy: Secondary | ICD-10-CM | POA: Diagnosis present

## 2014-09-24 DIAGNOSIS — C61 Malignant neoplasm of prostate: Secondary | ICD-10-CM

## 2014-09-24 DIAGNOSIS — J9601 Acute respiratory failure with hypoxia: Secondary | ICD-10-CM | POA: Diagnosis present

## 2014-09-24 DIAGNOSIS — I428 Other cardiomyopathies: Secondary | ICD-10-CM

## 2014-09-24 HISTORY — DX: Atherosclerotic heart disease of native coronary artery without angina pectoris: I25.10

## 2014-09-24 HISTORY — DX: Disorder of kidney and ureter, unspecified: N28.9

## 2014-09-24 HISTORY — DX: Ischemic cardiomyopathy: I25.5

## 2014-09-24 HISTORY — DX: Heart failure, unspecified: I50.9

## 2014-09-24 LAB — COMPREHENSIVE METABOLIC PANEL
ALT: 19 U/L (ref 0–53)
AST: 38 U/L — AB (ref 0–37)
Albumin: 2.4 g/dL — ABNORMAL LOW (ref 3.5–5.2)
Alkaline Phosphatase: 254 U/L — ABNORMAL HIGH (ref 39–117)
Anion gap: 11 (ref 5–15)
BILIRUBIN TOTAL: 1.3 mg/dL — AB (ref 0.3–1.2)
BUN: 22 mg/dL (ref 6–23)
CALCIUM: 8.9 mg/dL (ref 8.4–10.5)
CHLORIDE: 88 meq/L — AB (ref 96–112)
CO2: 38 meq/L — AB (ref 19–32)
CREATININE: 1.02 mg/dL (ref 0.50–1.35)
GFR, EST NON AFRICAN AMERICAN: 79 mL/min — AB (ref >90)
GLUCOSE: 145 mg/dL — AB (ref 70–99)
Potassium: <2.2 mEq/L — CL (ref 3.7–5.3)
Sodium: 137 mEq/L (ref 137–147)
Total Protein: 6.1 g/dL (ref 6.0–8.3)

## 2014-09-24 LAB — MRSA PCR SCREENING: MRSA by PCR: NEGATIVE

## 2014-09-24 LAB — CBC
HEMATOCRIT: 33.6 % — AB (ref 39.0–52.0)
HEMOGLOBIN: 10.4 g/dL — AB (ref 13.0–17.0)
MCH: 25.7 pg — AB (ref 26.0–34.0)
MCHC: 31 g/dL (ref 30.0–36.0)
MCV: 83.2 fL (ref 78.0–100.0)
Platelets: 303 10*3/uL (ref 150–400)
RBC: 4.04 MIL/uL — AB (ref 4.22–5.81)
RDW: 21 % — ABNORMAL HIGH (ref 11.5–15.5)
WBC: 5.9 10*3/uL (ref 4.0–10.5)

## 2014-09-24 LAB — PROTIME-INR
INR: 1.21 (ref 0.00–1.49)
PROTHROMBIN TIME: 15.3 s — AB (ref 11.6–15.2)

## 2014-09-24 LAB — TROPONIN I: Troponin I: 0.31 ng/mL (ref <0.30)

## 2014-09-24 LAB — MAGNESIUM: Magnesium: 1.8 mg/dL (ref 1.5–2.5)

## 2014-09-24 LAB — PRO B NATRIURETIC PEPTIDE: Pro B Natriuretic peptide (BNP): 1816 pg/mL — ABNORMAL HIGH (ref 0–125)

## 2014-09-24 MED ORDER — OXYCODONE HCL 10 MG PO TABS: 20.0000 mg | ORAL_TABLET | ORAL | Status: DC | PRN

## 2014-09-24 MED ORDER — FENTANYL 100 MCG/HR TD PT72
100.0000 ug | MEDICATED_PATCH | TRANSDERMAL | Status: DC
  Administered 2014-09-24 – 2014-09-27 (×2): 100 ug via TRANSDERMAL
  Filled 2014-09-24 (×2): qty 1

## 2014-09-24 MED ORDER — LORAZEPAM 1 MG PO TABS
2.0000 mg | ORAL_TABLET | Freq: Every evening | ORAL | Status: DC | PRN
  Administered 2014-09-27: 2 mg via ORAL
  Filled 2014-09-24: qty 2

## 2014-09-24 MED ORDER — OXYCODONE HCL 5 MG PO TABS
20.0000 mg | ORAL_TABLET | ORAL | Status: DC | PRN
  Administered 2014-09-26 – 2014-09-28 (×11): 20 mg via ORAL
  Filled 2014-09-24 (×12): qty 4

## 2014-09-24 MED ORDER — AMIODARONE HCL IN DEXTROSE 360-4.14 MG/200ML-% IV SOLN
30.0000 mg/h | INTRAVENOUS | Status: DC
  Administered 2014-09-25: 30 mg/h via INTRAVENOUS
  Filled 2014-09-24 (×6): qty 200

## 2014-09-24 MED ORDER — LISINOPRIL 5 MG PO TABS
5.0000 mg | ORAL_TABLET | Freq: Every day | ORAL | Status: DC
  Administered 2014-09-25 – 2014-09-27 (×3): 5 mg via ORAL
  Filled 2014-09-24 (×4): qty 1

## 2014-09-24 MED ORDER — METOPROLOL TARTRATE 50 MG PO TABS
50.0000 mg | ORAL_TABLET | Freq: Two times a day (BID) | ORAL | Status: DC
  Filled 2014-09-24 (×7): qty 1

## 2014-09-24 MED ORDER — ASPIRIN EC 81 MG PO TBEC
81.0000 mg | DELAYED_RELEASE_TABLET | Freq: Every day | ORAL | Status: DC
  Administered 2014-09-24 – 2014-10-04 (×11): 81 mg via ORAL
  Filled 2014-09-24 (×13): qty 1

## 2014-09-24 MED ORDER — SIMVASTATIN 40 MG PO TABS
40.0000 mg | ORAL_TABLET | Freq: Every evening | ORAL | Status: DC
  Administered 2014-09-24: 40 mg via ORAL
  Filled 2014-09-24 (×2): qty 1

## 2014-09-24 MED ORDER — NITROGLYCERIN 0.4 MG SL SUBL: 0.4000 mg | SUBLINGUAL_TABLET | SUBLINGUAL | Status: DC | PRN

## 2014-09-24 MED ORDER — POTASSIUM CHLORIDE CRYS ER 20 MEQ PO TBCR
20.0000 meq | EXTENDED_RELEASE_TABLET | Freq: Every day | ORAL | Status: DC
  Administered 2014-09-24 – 2014-09-25 (×2): 20 meq via ORAL
  Filled 2014-09-24 (×4): qty 1

## 2014-09-24 MED ORDER — ONDANSETRON HCL 4 MG/2ML IJ SOLN
4.0000 mg | Freq: Four times a day (QID) | INTRAMUSCULAR | Status: DC | PRN
  Administered 2014-09-25: 4 mg via INTRAVENOUS
  Filled 2014-09-24: qty 2

## 2014-09-24 MED ORDER — ACETAMINOPHEN 325 MG PO TABS: 650.0000 mg | ORAL_TABLET | ORAL | Status: DC | PRN

## 2014-09-24 MED ORDER — LIDOCAINE IN D5W 4-5 MG/ML-% IV SOLN
2.0000 mg/min | INTRAVENOUS | Status: DC
  Administered 2014-09-24 – 2014-09-25 (×2): 2 mg/min via INTRAVENOUS
  Filled 2014-09-24 (×3): qty 500

## 2014-09-24 MED ORDER — PREDNISONE 10 MG PO TABS
10.0000 mg | ORAL_TABLET | Freq: Every day | ORAL | Status: DC
Start: 2014-09-24 — End: 2014-10-03
  Administered 2014-09-24 – 2014-10-02 (×9): 10 mg via ORAL
  Filled 2014-09-24 (×10): qty 1

## 2014-09-24 MED ORDER — HEPARIN SODIUM (PORCINE) 5000 UNIT/ML IJ SOLN
5000.0000 [IU] | Freq: Three times a day (TID) | INTRAMUSCULAR | Status: DC
  Administered 2014-09-24 – 2014-09-26 (×6): 5000 [IU] via SUBCUTANEOUS
  Filled 2014-09-24 (×7): qty 1

## 2014-09-24 MED ORDER — TRAZODONE HCL 50 MG PO TABS
50.0000 mg | ORAL_TABLET | Freq: Every evening | ORAL | Status: DC | PRN
  Administered 2014-09-26 – 2014-09-27 (×2): 50 mg via ORAL
  Filled 2014-09-24 (×2): qty 1

## 2014-09-24 MED ORDER — ASPIRIN 81 MG PO CHEW
CHEWABLE_TABLET | ORAL | Status: AC
  Filled 2014-09-24: qty 1

## 2014-09-24 MED ORDER — FUROSEMIDE 40 MG PO TABS
40.0000 mg | ORAL_TABLET | Freq: Every day | ORAL | Status: DC
  Administered 2014-09-24 – 2014-09-25 (×2): 40 mg via ORAL
  Filled 2014-09-24 (×2): qty 1

## 2014-09-24 MED ORDER — AMIODARONE HCL IN DEXTROSE 360-4.14 MG/200ML-% IV SOLN
60.0000 mg/h | INTRAVENOUS | Status: AC
  Administered 2014-09-24 (×2): 60 mg/h via INTRAVENOUS

## 2014-09-24 MED ORDER — LORAZEPAM 1 MG PO TABS: 2.0000 mg | ORAL_TABLET | Freq: Every evening | ORAL | Status: DC | PRN

## 2014-09-24 MED ORDER — LIDOCAINE BOLUS VIA INFUSION
100.0000 mg | Freq: Once | INTRAVENOUS | Status: AC
  Administered 2014-09-24: 100 mg via INTRAVENOUS
  Filled 2014-09-24: qty 100

## 2014-09-24 MED ORDER — POTASSIUM CHLORIDE 10 MEQ/100ML IV SOLN
10.0000 meq | Freq: Every day | INTRAVENOUS | Status: DC | PRN
  Administered 2014-09-24 (×3): 10 meq via INTRAVENOUS
  Filled 2014-09-24 (×3): qty 100

## 2014-09-24 MED ORDER — AMIODARONE HCL IN DEXTROSE 360-4.14 MG/200ML-% IV SOLN
INTRAVENOUS | Status: AC
  Filled 2014-09-24: qty 200

## 2014-09-24 MED ORDER — MAGNESIUM SULFATE 40 MG/ML IJ SOLN
2.0000 g | INTRAMUSCULAR | Status: DC | PRN
  Administered 2014-09-25: 2 g via INTRAVENOUS
  Filled 2014-09-24: qty 50

## 2014-09-24 MED ORDER — SENNOSIDES-DOCUSATE SODIUM 8.6-50 MG PO TABS
1.0000 | ORAL_TABLET | Freq: Every day | ORAL | Status: DC | PRN
  Administered 2014-09-29 – 2014-09-30 (×2): 1 via ORAL
  Filled 2014-09-24 (×3): qty 1

## 2014-09-24 NOTE — H&P (Signed)
Patient ID: Benjamin Rice MRN: [id number], DOB/AGE: [birth year]   Admit date: 09/13/2014   Primary Physician: No primary provider on file. Primary Cardiologist: Dr. Sharyon Cable at [facility]. Profile:   58 year old African American male with past medical history significant for hypertension, chronic CHF, history of prostatic cancer with bone metastasis, history of CAD status post multiple MI, history of ischemic cardiomyopathy, history of renal insufficiency, status post St. Jude ICD in 2008 for primary prevention present with polymorphic vt storm at [place] and transferred to [facility] for further eval  Problem List  Past Medical History  Diagnosis Date  . Cancer     prostate CA with bone mets  . Hypertension   . CAD (coronary artery disease)     s/p 5 MI, last cath 2010 per pt in [place] or [place]  . Ischemic cardiomyopathy     s/p St Jude ICD 2008  . Chronic CHF   . Renal insufficiency     Past Surgical History  Procedure Laterality Date  . Hernia repair    . Pacemaker insertion      2008 St jude     Allergies  No Known Allergies  HPI  The patient is a 58 year old African American male with past medical history significant for hypertension, chronic CHF, history of prostatic cancer with bone metastasis, history of CAD status post multiple MI, history of ischemic cardiomyopathy, history of renal insufficiency, status post St. Jude ICD in 2008 for primary prevention. According to the patient, he is status post chemotherapy for cancer. He had a recent ICD fire 2 to 4 months ago. His last followup with his cardiologist was over a month ago at that time some his medication were adjusted. According to the patient, he has been compliant with Lasix and potassium chloride.   He was laying on his bed around 6 AM in the morning of 09/25/2014 when his ICD shock him. His daughter found him on his bed with his eyes rolled backward. EMS was called. Around 6:30 AM, patient was  shocked again by his ICD. He eventually presented to [facility]. Initial labs were significant for creatinine of 1.4, potassium 2.5 and pH of 7.6 on ABG. He was placed on amiodarone drip. Patient was subsequently transferred to [facility] for further evaluation.  On arrival to [facility], patient heart rate was between 50-110. Blood pressure 106/56. Patient was awake and alert. However he is a poor historian and unable to recall most of his previous records. He denies any significant shortness of breath or chest discomfort recently. He denies any recent orthopnea or paroxysmal nocturnal dyspnea, however does admit he has some intermittent lower extremity edema.    Home Medications  Prior to Admission medications   Medication Sig Start Date End Date Taking? Authorizing Provider  aspirin EC 81 MG tablet Take 81 mg by mouth daily.    Historical Provider, MD  Calcium Carbonate-Vitamin D (CALCIUM + D PO) Take 1 tablet by mouth daily. 600 mg/ 800 units    Historical Provider, MD  fentaNYL (DURAGESIC - DOSED MCG/HR) 100 MCG/HR Place 1 patch onto the skin every 3 (three) days.    Historical Provider, MD  furosemide (LASIX) 40 MG tablet Take 40 mg by mouth daily.    Historical Provider, MD  lisinopril (PRINIVIL,ZESTRIL) 5 MG tablet Take 5 mg by mouth daily.    Historical Provider, MD  LORazepam (ATIVAN) 2 MG tablet Take 2 mg by mouth at bedtime as needed  for anxiety (and sleep).    Historical Provider, MD  metoprolol (LOPRESSOR) 50 MG tablet Take 50 mg by mouth 2 (two) times daily.    Historical Provider, MD  Oxycodone HCl 10 MG TABS Take 20 mg by mouth every 3 (three) hours as needed (for pain).    Historical Provider, MD  potassium chloride (MICRO-K) 10 MEQ CR capsule Take 10 mEq by mouth daily.    Historical Provider, MD  predniSONE (DELTASONE) 10 MG tablet Take 10 mg by mouth daily.    Historical Provider, MD  senna-docusate (SENOKOT-S) 8.6-50 MG per tablet Take 1  tablet by mouth daily as needed for constipation.    Historical Provider, MD  simvastatin (ZOCOR) 40 MG tablet Take 40 mg by mouth every evening.    Historical Provider, MD  traZODone (DESYREL) 50 MG tablet Take 50 mg by mouth at bedtime as needed for sleep.    Historical Provider, MD    Family History  Family History  Problem Relation Age of Onset  . Heart disease Mother     unknown age of onset    Social History  History   Social History  . Marital Status: Married    Spouse Name: N/A    Number of Children: N/A  . Years of Education: N/A   Occupational History  . Not on file.   Social History Main Topics  . Smoking status: Never Smoker   . Smokeless tobacco: Never Used  . Alcohol Use: No  . Drug Use: No  . Sexual Activity: Not on file   Other Topics Concern  . Not on file   Social History Narrative  . No narrative on file     Review of Systems General:  No chills, fever, night sweats or weight changes.  Cardiovascular:  No chest pain, dyspnea on exertion, orthopnea, palpitations, paroxysmal nocturnal dyspnea. +LE edema Dermatological: No rash, lesions/masses Respiratory: No cough, dyspnea Urologic: No hematuria, dysuria Abdominal:   No nausea, vomiting, diarrhea, bright red blood per rectum, melena, or hematemesis Neurologic:  No visual changes, wkns, changes in mental status. All other systems reviewed and are otherwise negative except as noted above.  Physical Exam  Blood pressure 100/62, pulse 90, resp. rate 12, height 5\' 10"  (1.778 m), weight 195 lb 15.8 oz (88.9 kg), SpO2 98.00%.  General: Pleasant, NAD Psych: Normal affect. Neuro: Alert and oriented X 3. Moves all extremities spontaneously. HEENT: Normal  Neck: Supple without bruits or JVD. Lungs:  Resp regular and unlabored, CTA. Heart: RRR no s3, s4, or murmurs. Abdomen: Soft, non-tender, non-distended, BS + x 4.  Extremities: No clubbing, cyanosis. DP/PT/Radials 2+ and equal bilaterally. 0-1+ LE  edema  Labs  Troponin (Point of Care Test) No results found for this basename: TROPIPOC,  in the last 72 hours No results found for this basename: CKTOTAL, CKMB, TROPONINI,  in the last 72 hours Lab Results  Component Value Date   WBC 4.2 06/05/2013   HGB 10.9* 06/05/2013   HCT 34.6* 06/05/2013   MCV 84.8 06/05/2013   PLT 180 06/05/2013   No results found for this basename: NA, K, CL, CO2, BUN, CREATININE, CALCIUM, LABALBU, PROT, BILITOT, ALKPHOS, ALT, AST, GLUCOSE,  in the last 168 hours No results found for this basename: CHOL,  HDL,  LDLCALC,  TRIG   No results found for this basename: DDIMER     Radiology/Studies  No results found.   Echocardiogram  No record of previous echocardiogram     ASSESSMENT AND PLAN  1. Polymorphic VT  - maybe related to electrolyte imbalance and LV dysfunction (degree of LV dysfunction or ischemia and is unknown at this time.  - obtain labs, admit to ICU  - obtain Echo in AM to assess LV function  - potential cardiac cath (on Wednesday or sooner)  - continue lidocaine and amiodarone gtt  - Keep K > 4.0 and Mg > 2.0  2. CAD s/p 5 MI  - need to check with daughter where was last cath ( he tells me 2010), obtain record. Patient is a poor historian   3. Chronic CHF 4. Ischemic cardiomyopathy  - pending echo  - on 40mg  PO lasix at home, but only on 73meq of KCl at home, will increase KCl as his K was 2.5 on arrival  5. Hypertension 6. history of prostatic cancer with bone metastasis  - continue pain medication as needed 7. history of renal insufficiency 8. status post St. Jude ICD in 2008 for primary prevention  - device interrogated at [facility] 9. Severe hypokalemia  - K 2.5 s/p repletion  Hilbert Corrigan, PA-C 09/08/2014, 5:17 PM  EP Attending  Patient seen and examined. Agree with above with modifications. The patient is critically ill. He denies angina. He has minimal CHF symptoms. He has had at least 10 shocks today for  polymorphic VT. We will replete electrolytes, use IV amio and lidocaine, and check echo. He will likely need heart catheterization to try and understand why he is having all of this VT.   Mikle Bosworth.D.

## 2014-09-24 NOTE — Progress Notes (Signed)
MD notified of pt SBP 85-90/40-60's and HR jumping around from 60's-120's. He was also notified of pt stating he has not had an appetite for the last few days and when he eats he usually throws his food back up. Pt stated he had a BM yesterday. Pt has chemotherapy pills at home and it was requested that family bring those in tomorrow. Family currently at bedside. Pt currently w/o complaints of pain and AAO with GCS 15. Will continue to monitor.

## 2014-09-24 NOTE — Progress Notes (Signed)
  Amiodarone Drug - Drug Interaction Consult Note  Recommendations: Monitor for now.  No dosing changes recommended.  Amiodarone is metabolized by the cytochrome P450 system and therefore has the potential to cause many drug interactions. Amiodarone has an average plasma half-life of 50 days (range 20 to 100 days).   There is potential for drug interactions to occur several weeks or months after stopping treatment and the onset of drug interactions may be slow after initiating amiodarone.   [x]  Statins: Increased risk of myopathy. Simvastatin- restrict dose to 20mg  daily. Other statins: counsel patients to report any muscle pain or weakness immediately.  []  Anticoagulants: Amiodarone can increase anticoagulant effect. Consider warfarin dose reduction. Patients should be monitored closely and the dose of anticoagulant altered accordingly, remembering that amiodarone levels take several weeks to stabilize.  []  Antiepileptics: Amiodarone can increase plasma concentration of phenytoin, the dose should be reduced. Note that small changes in phenytoin dose can result in large changes in levels. Monitor patient and counsel on signs of toxicity.  [x]  Beta blockers: increased risk of bradycardia, AV block and myocardial depression. Sotalol - avoid concomitant use.  []   Calcium channel blockers (diltiazem and verapamil): increased risk of bradycardia, AV block and myocardial depression.  []   Cyclosporine: Amiodarone increases levels of cyclosporine. Reduced dose of cyclosporine is recommended.  []  Digoxin dose should be halved when amiodarone is started.  [x]  Diuretics: increased risk of cardiotoxicity if hypokalemia occurs.  []  Oral hypoglycemic agents (glyburide, glipizide, glimepiride): increased risk of hypoglycemia. Patient's glucose levels should be monitored closely when initiating amiodarone therapy.   []  Drugs that prolong the QT interval:  Torsades de pointes risk may be increased with  concurrent use - avoid if possible.  Monitor QTc, also keep magnesium/potassium WNL if concurrent therapy can't be avoided. Marland Kitchen Antibiotics: e.g. fluoroquinolones, erythromycin. . Antiarrhythmics: e.g. quinidine, procainamide, disopyramide, sotalol. . Antipsychotics: e.g. phenothiazines, haloperidol.  . Lithium, tricyclic antidepressants, and methadone. Thank You,  Pat Patrick  09/19/2014 5:36 PM

## 2014-09-25 ENCOUNTER — Encounter (HOSPITAL_COMMUNITY): Payer: Self-pay

## 2014-09-25 DIAGNOSIS — I359 Nonrheumatic aortic valve disorder, unspecified: Secondary | ICD-10-CM

## 2014-09-25 LAB — POCT I-STAT 3, ART BLOOD GAS (G3+)
Acid-Base Excess: 17 mmol/L — ABNORMAL HIGH (ref 0.0–2.0)
BICARBONATE: 42 meq/L — AB (ref 20.0–24.0)
O2 Saturation: 97 %
PCO2 ART: 52 mmHg — AB (ref 35.0–45.0)
PH ART: 7.515 — AB (ref 7.350–7.450)
PO2 ART: 84 mmHg (ref 80.0–100.0)
Patient temperature: 98.2
TCO2: 44 mmol/L (ref 0–100)

## 2014-09-25 LAB — MAGNESIUM: Magnesium: 2.4 mg/dL (ref 1.5–2.5)

## 2014-09-25 LAB — BASIC METABOLIC PANEL
Anion gap: 9 (ref 5–15)
Anion gap: 7 (ref 5–15)
BUN: 21 mg/dL (ref 6–23)
BUN: 20 mg/dL (ref 6–23)
CHLORIDE: 88 meq/L — AB (ref 96–112)
CO2: 40 meq/L — AB (ref 19–32)
CO2: 43 mEq/L (ref 19–32)
CREATININE: 1.1 mg/dL (ref 0.50–1.35)
Calcium: 8.8 mg/dL (ref 8.4–10.5)
Calcium: 9 mg/dL (ref 8.4–10.5)
Chloride: 88 mEq/L — ABNORMAL LOW (ref 96–112)
Creatinine, Ser: 1.01 mg/dL (ref 0.50–1.35)
GFR calc Af Amer: >90 mL/min (ref >90)
GFR calc non Af Amer: 80 mL/min — ABNORMAL LOW (ref >90)
GFR calc non Af Amer: 72 mL/min — ABNORMAL LOW (ref >90)
GFR, EST AFRICAN AMERICAN: 84 mL/min — AB (ref >90)
GLUCOSE: 108 mg/dL — AB (ref 70–99)
GLUCOSE: 173 mg/dL — AB (ref 70–99)
POTASSIUM: 2.6 meq/L — AB (ref 3.7–5.3)
POTASSIUM: 3.5 meq/L — AB (ref 3.7–5.3)
Sodium: 137 mEq/L (ref 137–147)
Sodium: 138 mEq/L (ref 137–147)

## 2014-09-25 LAB — TROPONIN I: TROPONIN I: 0.34 ng/mL — AB (ref <0.30)

## 2014-09-25 MED ORDER — PERFLUTREN LIPID MICROSPHERE
1.0000 mL | INTRAVENOUS | Status: AC | PRN
  Administered 2014-09-25: 2 mL via INTRAVENOUS
  Filled 2014-09-25: qty 10

## 2014-09-25 MED ORDER — BOOST / RESOURCE BREEZE PO LIQD
1.0000 | Freq: Three times a day (TID) | ORAL | Status: DC
  Administered 2014-09-25 – 2014-09-29 (×7): 1 via ORAL
  Administered 2014-09-29: 17:00:00 via ORAL
  Administered 2014-09-30 (×3): 1 via ORAL
  Administered 2014-10-01: 22:00:00 via ORAL
  Administered 2014-10-01 – 2014-10-04 (×10): 1 via ORAL

## 2014-09-25 MED ORDER — CETYLPYRIDINIUM CHLORIDE 0.05 % MT LIQD
7.0000 mL | Freq: Two times a day (BID) | OROMUCOSAL | Status: DC
Start: 2014-09-25 — End: 2014-10-06
  Administered 2014-09-25 – 2014-10-05 (×21): 7 mL via OROMUCOSAL

## 2014-09-25 MED ORDER — POTASSIUM CHLORIDE CRYS ER 20 MEQ PO TBCR
20.0000 meq | EXTENDED_RELEASE_TABLET | Freq: Once | ORAL | Status: AC
  Administered 2014-09-25: 20 meq via ORAL
  Filled 2014-09-25: qty 1

## 2014-09-25 MED ORDER — POTASSIUM CHLORIDE 10 MEQ/50ML IV SOLN
10.0000 meq | INTRAVENOUS | Status: AC
  Administered 2014-09-25 (×4): 10 meq via INTRAVENOUS
  Filled 2014-09-25 (×4): qty 50

## 2014-09-25 MED ORDER — INFLUENZA VAC SPLIT QUAD 0.5 ML IM SUSY
0.5000 mL | PREFILLED_SYRINGE | INTRAMUSCULAR | Status: DC
  Filled 2014-09-25: qty 0.5

## 2014-09-25 MED ORDER — POTASSIUM CHLORIDE CRYS ER 20 MEQ PO TBCR
40.0000 meq | EXTENDED_RELEASE_TABLET | Freq: Once | ORAL | Status: AC
  Administered 2014-09-25: 40 meq via ORAL

## 2014-09-25 MED ORDER — PERFLUTREN LIPID MICROSPHERE
INTRAVENOUS | Status: AC
Start: 2014-09-25 — End: 2014-09-25
  Administered 2014-09-25: 2 mL via INTRAVENOUS
  Filled 2014-09-25: qty 10

## 2014-09-25 MED ORDER — ATORVASTATIN CALCIUM 20 MG PO TABS
20.0000 mg | ORAL_TABLET | Freq: Every day | ORAL | Status: DC
  Administered 2014-09-25 – 2014-10-03 (×9): 20 mg via ORAL
  Filled 2014-09-25 (×10): qty 1

## 2014-09-25 MED ORDER — POTASSIUM CHLORIDE CRYS ER 20 MEQ PO TBCR
60.0000 meq | EXTENDED_RELEASE_TABLET | Freq: Once | ORAL | Status: AC
  Administered 2014-09-25: 60 meq via ORAL
  Filled 2014-09-25: qty 3

## 2014-09-25 NOTE — Progress Notes (Addendum)
INITIAL NUTRITION ASSESSMENT  DOCUMENTATION CODES Per approved criteria  -Not Applicable   INTERVENTION: Resource Breeze po TID, each supplement provides 250 kcal and 9 grams of protein  Recommend liberalize diet as Renal diet restricts potassium.   NUTRITION DIAGNOSIS: Inadequate oral intake related to N/V as evidenced by per pt report.   Goal: Pt to meet >/= 90% of their estimated nutrition needs   Monitor:  PO intake, supplement acceptance, weight trends, labs  Reason for Assessment: Pt identified as at nutrition risk on the Malnutrition Screen Tool  58 y.o. male   ASSESSMENT: Pt admitted with polymorphic VT and severe hypokalemia. Pt with hx of metastatic prostate cancer.   Pt seemed somewhat confused. Pt reports that up until 3 days ago he was eating "like a pig". For the last 3 days pt reports not being able to keep food down. No family present, they had just left. Pt lives at home with his wife and she does the cooking. Pt unsure about weight loss.   Nutrition Focused Physical Exam:  Subcutaneous Fat:  Orbital Region: WDL Upper Arm Region: Sports administrator and Lumbar Region: WDL  Muscle:  Temple Region: WDL Clavicle Bone Region: WDL  Clavicle and Acromion Bone Region: WDL Scapular Bone Region: NA Dorsal Hand: WDL Patellar Region: WDL Anterior Thigh Region: WDL Posterior Calf Region: WDL  Edema: +2 RLE and LLE  Height: Ht Readings from Last 1 Encounters:  09/02/2014 5\' 10"  (1.778 m)    Weight: Wt Readings from Last 1 Encounters:  08/28/2014 195 lb 15.8 oz (88.9 kg)    Ideal Body Weight: 75.4 kg   % Ideal Body Weight: 118%  Wt Readings from Last 10 Encounters:  09/05/2014 195 lb 15.8 oz (88.9 kg)  05/10/14 217 lb (98.431 kg)    Usual Body Weight: 212 lb   % Usual Body Weight: 92%  BMI:  Body mass index is 28.12 kg/(m^2).  Estimated Nutritional Needs: Kcal: 2000-2200 Protein: 100-120 grams Fluid: > 1.5 L/day  Skin: WDL   Diet Order:  Renal  EDUCATION NEEDS: -No education needs identified at this time   Intake/Output Summary (Last 24 hours) at 09/25/14 1016 Last data filed at 09/25/14 0800  Gross per 24 hour  Intake 1515.95 ml  Output   1250 ml  Net 265.95 ml    Last BM: 9/27   Labs:   Recent Labs Lab 09/05/2014 1848 09/23/2014 2325 09/25/14 0629  NA 137 137 138  K <2.2* 2.6* 3.5*  CL 88* 88* 88*  CO2 38* 40* 43*  BUN 22 21 20   CREATININE 1.02 1.01 1.10  CALCIUM 8.9 8.8 9.0  MG 1.8  --  2.4  GLUCOSE 145* 108* 173*    CBG (last 3)  No results found for this basename: GLUCAP,  in the last 72 hours  Scheduled Meds: . antiseptic oral rinse  7 mL Mouth Rinse BID  . aspirin EC  81 mg Oral Daily  . fentaNYL  100 mcg Transdermal Q72H  . furosemide  40 mg Oral Daily  . heparin  5,000 Units Subcutaneous 3 times per day  . [START ON 09/26/2014] Influenza vac split quadrivalent PF  0.5 mL Intramuscular Tomorrow-1000  . lisinopril  5 mg Oral Daily  . metoprolol  50 mg Oral BID  . potassium chloride  20 mEq Oral Daily  . predniSONE  10 mg Oral Daily  . simvastatin  40 mg Oral QPM    Continuous Infusions: . amiodarone 30 mg/hr (09/25/14 0800)  .  lidocaine 2 mg/min (09/25/14 0800)    Past Medical History  Diagnosis Date  . Cancer     prostate CA with bone mets  . Hypertension   . CAD (coronary artery disease)     s/p 5 MI, last cath 2010 per pt in [place] or [place]  . Ischemic cardiomyopathy     s/p [employer] Jude ICD 2008  . Chronic CHF   . Renal insufficiency     Past Surgical History  Procedure Laterality Date  . Hernia repair    . Pacemaker insertion      2008 [place], [place], [place] Pager [phone] After Hours Pager

## 2014-09-25 NOTE — Progress Notes (Signed)
Utilization Review Completed.Benjamin Rice T9/29/2015  

## 2014-09-25 NOTE — Progress Notes (Signed)
Critical potassium of < 2.2 and troponin 0.31. Dr. Eulas Post paged of results. Potassium runs started per order.

## 2014-09-25 NOTE — Progress Notes (Signed)
CRITICAL VALUE ALERT  Critical value received:  K+ 2.6, serum Co2 40  Date of notification:  09/25/14  Time of notification:  0135  Critical value read back:Yes.    Nurse who received alert:  S.Tenisha Fleece, RN  MD notified (1st page):  Dr. Alejandro Mulling  Time of first page:  0135  MD notified (2nd page):  Time of second page:  Responding MD:  Dr. Claiborne Billings  Time MD responded:  [phone]

## 2014-09-25 NOTE — Progress Notes (Signed)
Patient ID: Benjamin Rice, male   DOB: 15-Apr-1956, 58 y.o.   MRN: [id number]   Patient Name: Benjamin Rice Date of Encounter: 09/25/2014     Active Problems:   Ventricular tachycardia, polymorphic    SUBJECTIVE  "I feel better", denies chest pain or sob.   CURRENT MEDS . antiseptic oral rinse  7 mL Mouth Rinse BID  . aspirin EC  81 mg Oral Daily  . fentaNYL  100 mcg Transdermal Q72H  . furosemide  40 mg Oral Daily  . heparin  5,000 Units Subcutaneous 3 times per day  . [START ON 09/04/2014] Influenza vac split quadrivalent PF  0.5 mL Intramuscular Tomorrow-1000  . lisinopril  5 mg Oral Daily  . metoprolol  50 mg Oral BID  . potassium chloride  20 mEq Oral Daily  . predniSONE  10 mg Oral Daily  . simvastatin  40 mg Oral QPM    OBJECTIVE  Filed Vitals:   09/25/14 0500 09/25/14 0600 09/25/14 0700 09/25/14 0745  BP: 97/58 92/47 97/56    Pulse: 60 59 59   Temp:    97.9 F (36.6 C)  TempSrc:    Oral  Resp: 14 13 17    Height:      Weight:      SpO2: 99% 100% 100%     Intake/Output Summary (Last 24 hours) at 09/25/14 0817 Last data filed at 09/25/14 0600  Gross per 24 hour  Intake 1422.55 ml  Output   1100 ml  Net 322.55 ml   Filed Weights   09/22/2014 1600  Weight: 195 lb 15.8 oz (88.9 kg)    PHYSICAL EXAM  General: Pleasant, 58 yo man, NAD. Neuro: Alert and oriented X 3. Moves all extremities spontaneously. HEENT:  Normal  Neck: Supple without bruits or JVD. Lungs:  Resp regular and unlabored, CTA. Heart: RRR no s3, s4, or murmurs. Abdomen: Soft, non-tender, non-distended, BS + x 4.  Extremities: No clubbing, cyanosis or edema. DP/PT/Radials 2+ and equal bilaterally.  Accessory Clinical Findings  CBC  Recent Labs  08/29/2014 1848  WBC 5.9  HGB 10.4*  HCT 33.6*  MCV 83.2  PLT 650   Basic Metabolic Panel  Recent Labs  09/26/2014 1848 09/22/2014 2325 09/25/14 0629  NA 137 137 138  K <2.2* 2.6* 3.5*  CL 88* 88* 88*  CO2 38* 40* 43*  GLUCOSE 145* 108*  173*  BUN 22 21 20   CREATININE 1.02 1.01 1.10  CALCIUM 8.9 8.8 9.0  MG 1.8  --  2.4   Liver Function Tests  Recent Labs  08/30/2014 1848  AST 38*  ALT 19  ALKPHOS 254*  BILITOT 1.3*  PROT 6.1  ALBUMIN 2.4*   No results found for this basename: LIPASE, AMYLASE,  in the last 72 hours Cardiac Enzymes  Recent Labs  09/05/2014 1848 09/14/2014 2325 09/25/14 0629  TROPONINI 0.31* 0.34* <0.30   BNP No components found with this basename: POCBNP,  D-Dimer No results found for this basename: DDIMER,  in the last 72 hours Hemoglobin A1C No results found for this basename: HGBA1C,  in the last 72 hours Fasting Lipid Panel No results found for this basename: CHOL, HDL, LDLCALC, TRIG, CHOLHDL, LDLDIRECT,  in the last 72 hours Thyroid Function Tests No results found for this basename: TSH, T4TOTAL, FREET3, T3FREE, THYROIDAB,  in the last 72 hours  TELE  NSR with very dense ventricular ectopy followed by nsr with minimal ectopy.  ECG  nsr  Radiology/Studies  Dg Chest Port 1  View  09/02/2014   CLINICAL DATA:  Renal insufficiency. Ischemic cardiomyopathy. Short of breath.  EXAM: PORTABLE CHEST - 1 VIEW  COMPARISON:  None.  FINDINGS: Low volume chest. Low volumes accentuate the pulmonary vascular markings. There is no gross consolidation. The cardiopericardial silhouette is within normal limits.  Two lead LEFT subclavian AICD. LEFT ventricular apex and RIGHT atrial appendage leads.  Diffuse osseous sclerosis compatible with prostate cancer metastases noted from clinic visit 05/10/2014.  IMPRESSION: 1. Low volume chest without gross acute cardiopulmonary disease. Consider repeat PA and lateral with full inspiration when patient condition permits. 2. 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   Electronically Signed   By: Dereck Ligas M.D.   On: 09/06/2014 18:06    ASSESSMENT AND PLAN 1. Recurrent Polymorphic VT 2. S/p multiple ICD shocks 3. Profound  hypokalemia, s/p repletion 4. Ischemic CM, s/p multiple MI's. 5. Metastatic prostate CA Rec: he is much improved with repletion of electrolytes. He denies a h/o vomiting or diarrhea. Unclear why potassium stores so low. He is on chronic diuretic therapy. Will continue to replete potassium and magnesium and check echo today. I would anticipate catheterization tomorrow to rule out ischemia as the cause of his VT storm. Will continue IV amio and lidocaine for now with plans to stop lidocaine tomorrow and transition to po amio.  Benjamin Rice,M.D.  09/25/2014 8:17 AM

## 2014-09-25 NOTE — Progress Notes (Signed)
  Echocardiogram 2D Echocardiogram has been performed.  Diamond Nickel 09/25/2014, 3:41 PM

## 2014-09-26 ENCOUNTER — Encounter (HOSPITAL_COMMUNITY): Admission: AD | Disposition: E | Payer: Self-pay | Source: Other Acute Inpatient Hospital | Attending: Internal Medicine

## 2014-09-26 DIAGNOSIS — I428 Other cardiomyopathies: Secondary | ICD-10-CM

## 2014-09-26 HISTORY — PX: LEFT HEART CATHETERIZATION WITH CORONARY ANGIOGRAM: SHX5451

## 2014-09-26 LAB — BASIC METABOLIC PANEL
Anion gap: 9 (ref 5–15)
BUN: 22 mg/dL (ref 6–23)
CHLORIDE: 88 meq/L — AB (ref 96–112)
CO2: 39 mEq/L — ABNORMAL HIGH (ref 19–32)
Calcium: 8.2 mg/dL — ABNORMAL LOW (ref 8.4–10.5)
Creatinine, Ser: 1.13 mg/dL (ref 0.50–1.35)
GFR calc non Af Amer: 70 mL/min — ABNORMAL LOW (ref >90)
GFR, EST AFRICAN AMERICAN: 81 mL/min — AB (ref >90)
GLUCOSE: 106 mg/dL — AB (ref 70–99)
POTASSIUM: 3.3 meq/L — AB (ref 3.7–5.3)
Sodium: 136 mEq/L — ABNORMAL LOW (ref 137–147)

## 2014-09-26 LAB — GLUCOSE, CAPILLARY: Glucose-Capillary: 170 mg/dL — ABNORMAL HIGH (ref 70–99)

## 2014-09-26 SURGERY — LEFT HEART CATHETERIZATION WITH CORONARY ANGIOGRAM
Anesthesia: LOCAL

## 2014-09-26 MED ORDER — SODIUM CHLORIDE 0.9 % IV BOLUS (SEPSIS)
250.0000 mL | Freq: Once | INTRAVENOUS | Status: AC
  Administered 2014-09-26: 250 mL via INTRAVENOUS

## 2014-09-26 MED ORDER — SPIRONOLACTONE 25 MG PO TABS
25.0000 mg | ORAL_TABLET | Freq: Every day | ORAL | Status: DC
  Administered 2014-09-26 – 2014-09-28 (×3): 25 mg via ORAL
  Filled 2014-09-26 (×4): qty 1

## 2014-09-26 MED ORDER — SODIUM CHLORIDE 0.9 % IV SOLN: 250.0000 mL | INTRAVENOUS | Status: DC | PRN

## 2014-09-26 MED ORDER — ASPIRIN 81 MG PO CHEW
81.0000 mg | CHEWABLE_TABLET | ORAL | Status: AC
Start: 2014-09-26 — End: 2014-09-26

## 2014-09-26 MED ORDER — SODIUM CHLORIDE 0.9 % IV SOLN: INTRAVENOUS | Status: AC

## 2014-09-26 MED ORDER — VERAPAMIL HCL 2.5 MG/ML IV SOLN
INTRAVENOUS | Status: AC
  Filled 2014-09-26: qty 2

## 2014-09-26 MED ORDER — SODIUM CHLORIDE 0.9 % IV SOLN
1.0000 mL/kg/h | INTRAVENOUS | Status: DC
  Administered 2014-09-26 (×2): 1 mL/kg/h via INTRAVENOUS

## 2014-09-26 MED ORDER — POTASSIUM CHLORIDE CRYS ER 20 MEQ PO TBCR
40.0000 meq | EXTENDED_RELEASE_TABLET | Freq: Two times a day (BID) | ORAL | Status: DC
  Administered 2014-09-26 – 2014-09-27 (×3): 40 meq via ORAL
  Filled 2014-09-26 (×4): qty 2

## 2014-09-26 MED ORDER — MIDAZOLAM HCL 2 MG/2ML IJ SOLN
INTRAMUSCULAR | Status: AC
  Filled 2014-09-26: qty 2

## 2014-09-26 MED ORDER — HEPARIN (PORCINE) IN NACL 2-0.9 UNIT/ML-% IJ SOLN
INTRAMUSCULAR | Status: AC
  Filled 2014-09-26: qty 1500

## 2014-09-26 MED ORDER — SODIUM CHLORIDE 0.9 % IJ SOLN
3.0000 mL | Freq: Two times a day (BID) | INTRAMUSCULAR | Status: DC
  Administered 2014-09-26: 3 mL via INTRAVENOUS

## 2014-09-26 MED ORDER — HEPARIN SODIUM (PORCINE) 1000 UNIT/ML IJ SOLN
INTRAMUSCULAR | Status: AC
  Filled 2014-09-26: qty 1

## 2014-09-26 MED ORDER — AMIODARONE HCL 200 MG PO TABS
200.0000 mg | ORAL_TABLET | Freq: Two times a day (BID) | ORAL | Status: DC
  Administered 2014-09-26 – 2014-10-04 (×18): 200 mg via ORAL
  Filled 2014-09-26 (×22): qty 1

## 2014-09-26 MED ORDER — POTASSIUM CHLORIDE CRYS ER 20 MEQ PO TBCR: 40.0000 meq | EXTENDED_RELEASE_TABLET | Freq: Two times a day (BID) | ORAL | Status: DC

## 2014-09-26 MED ORDER — SODIUM CHLORIDE 0.9 % IJ SOLN: 3.0000 mL | INTRAMUSCULAR | Status: DC | PRN

## 2014-09-26 MED ORDER — LIDOCAINE HCL (PF) 1 % IJ SOLN
INTRAMUSCULAR | Status: AC
  Filled 2014-09-26: qty 30

## 2014-09-26 NOTE — CV Procedure (Signed)
      Cardiac Catheterization Operative Report  Benjamin Rice [id number] 9/30/20154:52 PM No primary provider on file.  Procedure Performed:  1. Selective Coronary Angiography  Operator: Lauree Chandler, MD  Arterial access site:  Right radial artery.   Indication: 58 yo male with history of cardiomyopathy, VT storm. Cardiac cath to exclude CAD.                                 Procedure Details: The risks, benefits, complications, treatment options, and expected outcomes were discussed with the patient. The patient and/or family concurred with the proposed plan, giving informed consent. The patient was brought to the cath lab after IV hydration was begun and oral premedication was given. The patient was further sedated with Versed. The right wrist was assessed with a reverse Allens test which was positive. The right wrist was prepped and draped in a sterile fashion. 1% lidocaine was used for local anesthesia. Using the modified Seldinger access technique, a 5 French sheath was placed in the right radial artery. 3 mg Verapamil was given through the sheath. 3500 units IV heparin was given. Standard diagnostic catheters were used to perform selective coronary angiography. I attempted to cross the aortic valve but the patient had radial artery spasm and catheters could not be advanced. I elected not to cross the aortic valve. The sheath was removed from the right radial artery and a VascBand hemostasis band was applied at the arteriotomy site on the right wrist.   There were no immediate complications. The patient was taken to the recovery area in stable condition.   Hemodynamic Findings: Central aortic pressure: 82/54  Angiographic Findings:  Left main: No obstructive disease.   Left Anterior Descending Artery: Large caliber vessel that courses to the apex. No obstructive disease. There are several very small caliber diagonal branches.   Circumflex Artery: Large caliber vessel with  large bifurcating obtuse marginal branch, moderate caliber second and third obtuse marginal branches. No obstructive disease.   Right Coronary Artery: Small non-dominant vessel with no obstructive disease.   Left Ventricular Angiogram: Deferred.   Impression: 1. No angiographic evidence of CAD  Recommendations: Further management of VT storm per EP team.        Complications:  None. The patient tolerated the procedure well.

## 2014-09-26 NOTE — Progress Notes (Signed)
Notified Jules Husbands MD, ordered 250 ml bolus

## 2014-09-26 NOTE — H&P (View-Only) (Signed)
Patient ID: Andry Bogden, male   DOB: 1956/01/13, 58 y.o.   MRN: [id number]   Patient Name: Benjamin Rice Date of Encounter: 09/18/2014     Active Problems:   Ventricular tachycardia, polymorphic    SUBJECTIVE   denies chest pain or sob. Notes that at home he was taking his diuretic 4 times a day.   CURRENT MEDS . antiseptic oral rinse  7 mL Mouth Rinse BID  . aspirin EC  81 mg Oral Daily  . atorvastatin  20 mg Oral q1800  . feeding supplement (RESOURCE BREEZE)  1 Container Oral TID BM  . fentaNYL  100 mcg Transdermal Q72H  . heparin  5,000 Units Subcutaneous 3 times per day  . Influenza vac split quadrivalent PF  0.5 mL Intramuscular Tomorrow-1000  . lisinopril  5 mg Oral Daily  . metoprolol  50 mg Oral BID  . potassium chloride  20 mEq Oral Daily  . predniSONE  10 mg Oral Daily  . sodium chloride  3 mL Intravenous Q12H    OBJECTIVE  Filed Vitals:   09/17/2014 0300 09/20/2014 0400 08/28/2014 0500 08/30/2014 0600  BP: 87/57 96/62 88/57  87/56  Pulse: 60 60 59 59  Temp: 97.9 F (36.6 C)     TempSrc: Oral     Resp: 11 10 10 12   Height:      Weight:      SpO2: 100% 99% 100% 100%    Intake/Output Summary (Last 24 hours) at 09/21/2014 0730 Last data filed at 09/25/2014 0600  Gross per 24 hour  Intake 1120.8 ml  Output   1500 ml  Net -379.2 ml   Filed Weights   09/17/2014 1600  Weight: 195 lb 15.8 oz (88.9 kg)    PHYSICAL EXAM  General: Pleasant, 58 yo man, NAD. Neuro: Alert and oriented X 3. Moves all extremities spontaneously. HEENT:  Normal  Neck: Supple without bruits or JVD. Lungs:  Resp regular and unlabored, CTA. Heart: RRR no s3, s4, or murmurs. Abdomen: Soft, non-tender, non-distended, BS + x 4.  Extremities: No clubbing, cyanosis or edema. DP/PT/Radials 2+ and equal bilaterally.  Accessory Clinical Findings  CBC  Recent Labs  09/22/2014 1848  WBC 5.9  HGB 10.4*  HCT 33.6*  MCV 83.2  PLT 660   Basic Metabolic Panel  Recent Labs  09/11/2014 1848   09/25/14 0629 09/09/2014 0445  NA 137  < > 138 136*  K <2.2*  < > 3.5* 3.3*  CL 88*  < > 88* 88*  CO2 38*  < > 43* 39*  GLUCOSE 145*  < > 173* 106*  BUN 22  < > 20 22  CREATININE 1.02  < > 1.10 1.13  CALCIUM 8.9  < > 9.0 8.2*  MG 1.8  --  2.4  --   < > = values in this interval not displayed. Liver Function Tests  Recent Labs  09/12/2014 1848  AST 38*  ALT 19  ALKPHOS 254*  BILITOT 1.3*  PROT 6.1  ALBUMIN 2.4*   No results found for this basename: LIPASE, AMYLASE,  in the last 72 hours Cardiac Enzymes  Recent Labs  09/15/2014 1848 09/01/2014 2325 09/25/14 0629  TROPONINI 0.31* 0.34* <0.30   BNP No components found with this basename: POCBNP,  D-Dimer No results found for this basename: DDIMER,  in the last 72 hours Hemoglobin A1C No results found for this basename: HGBA1C,  in the last 72 hours Fasting Lipid Panel No results found for this basename: CHOL,  HDL, LDLCALC, TRIG, CHOLHDL, LDLDIRECT,  in the last 72 hours Thyroid Function Tests No results found for this basename: TSH, T4TOTAL, FREET3, T3FREE, THYROIDAB,  in the last 72 hours  TELE  NSR with minimal ectopy.  ECG  nsr with atrial pacing  Radiology/Studies  Dg Chest Port 1 View  09/18/2014   CLINICAL DATA:  Renal insufficiency. Ischemic cardiomyopathy. Short of breath.  EXAM: PORTABLE CHEST - 1 VIEW  COMPARISON:  None.  FINDINGS: Low volume chest. Low volumes accentuate the pulmonary vascular markings. There is no gross consolidation. The cardiopericardial silhouette is within normal limits.  Two lead LEFT subclavian AICD. LEFT ventricular apex and RIGHT atrial appendage leads.  Diffuse osseous sclerosis compatible with prostate cancer metastases noted from clinic visit 05/10/2014.  IMPRESSION: 1. Low volume chest without gross acute cardiopulmonary disease. Consider repeat PA and lateral with full inspiration when patient condition permits. 2. 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    Electronically Signed   By: Dereck Ligas M.D.   On: 09/04/2014 18:06    ASSESSMENT AND PLAN 1. Recurrent Polymorphic VT 2. S/p multiple ICD shocks 3. Profound hypokalemia, s/p repletion, now low again 4. Ischemic CM, s/p multiple MI's.EF 20% by echo 5. Metastatic prostate CA Rec: he is much improved with repletion of electrolytes. He denies a h/o vomiting or diarrhea. Unclear why potassium stores so low. ABG shows metabolic alkalosis and respiratory acidosis. He is on chronic diuretic therapy and this has been held.  I would anticipate catheterization tomorrow to rule out ischemia as the cause of his VT storm. Will discontinue IV amio and lidocaine today and transition to po amio. Left heart cath today.  Ma Munoz,M.D.  09/18/2014 7:30 AM

## 2014-09-26 NOTE — Progress Notes (Signed)
Patient ID: Laster Appling, male   DOB: 1956-05-10, 58 y.o.   MRN: [id number]   Patient Name: Shriyans Kuenzi Date of Encounter: 09/12/2014     Active Problems:   Ventricular tachycardia, polymorphic    SUBJECTIVE   denies chest pain or sob. Notes that at home he was taking his diuretic 4 times a day.   CURRENT MEDS . antiseptic oral rinse  7 mL Mouth Rinse BID  . aspirin EC  81 mg Oral Daily  . atorvastatin  20 mg Oral q1800  . feeding supplement (RESOURCE BREEZE)  1 Container Oral TID BM  . fentaNYL  100 mcg Transdermal Q72H  . heparin  5,000 Units Subcutaneous 3 times per day  . Influenza vac split quadrivalent PF  0.5 mL Intramuscular Tomorrow-1000  . lisinopril  5 mg Oral Daily  . metoprolol  50 mg Oral BID  . potassium chloride  20 mEq Oral Daily  . predniSONE  10 mg Oral Daily  . sodium chloride  3 mL Intravenous Q12H    OBJECTIVE  Filed Vitals:   09/07/2014 0300 09/17/2014 0400 09/10/2014 0500 09/06/2014 0600  BP: 87/57 96/62 88/57  87/56  Pulse: 60 60 59 59  Temp: 97.9 F (36.6 C)     TempSrc: Oral     Resp: 11 10 10 12   Height:      Weight:      SpO2: 100% 99% 100% 100%    Intake/Output Summary (Last 24 hours) at 09/09/2014 0730 Last data filed at 09/24/2014 0600  Gross per 24 hour  Intake 1120.8 ml  Output   1500 ml  Net -379.2 ml   Filed Weights   09/23/2014 1600  Weight: 195 lb 15.8 oz (88.9 kg)    PHYSICAL EXAM  General: Pleasant, 58 yo man, NAD. Neuro: Alert and oriented X 3. Moves all extremities spontaneously. HEENT:  Normal  Neck: Supple without bruits or JVD. Lungs:  Resp regular and unlabored, CTA. Heart: RRR no s3, s4, or murmurs. Abdomen: Soft, non-tender, non-distended, BS + x 4.  Extremities: No clubbing, cyanosis or edema. DP/PT/Radials 2+ and equal bilaterally.  Accessory Clinical Findings  CBC  Recent Labs  09/23/2014 1848  WBC 5.9  HGB 10.4*  HCT 33.6*  MCV 83.2  PLT 664   Basic Metabolic Panel  Recent Labs  09/08/2014 1848   09/25/14 0629 09/26/14 0445  NA 137  < > 138 136*  K <2.2*  < > 3.5* 3.3*  CL 88*  < > 88* 88*  CO2 38*  < > 43* 39*  GLUCOSE 145*  < > 173* 106*  BUN 22  < > 20 22  CREATININE 1.02  < > 1.10 1.13  CALCIUM 8.9  < > 9.0 8.2*  MG 1.8  --  2.4  --   < > = values in this interval not displayed. Liver Function Tests  Recent Labs  09/04/2014 1848  AST 38*  ALT 19  ALKPHOS 254*  BILITOT 1.3*  PROT 6.1  ALBUMIN 2.4*   No results found for this basename: LIPASE, AMYLASE,  in the last 72 hours Cardiac Enzymes  Recent Labs  09/02/2014 1848 09/17/2014 2325 09/25/14 0629  TROPONINI 0.31* 0.34* <0.30   BNP No components found with this basename: POCBNP,  D-Dimer No results found for this basename: DDIMER,  in the last 72 hours Hemoglobin A1C No results found for this basename: HGBA1C,  in the last 72 hours Fasting Lipid Panel No results found for this basename: CHOL,  HDL, LDLCALC, TRIG, CHOLHDL, LDLDIRECT,  in the last 72 hours Thyroid Function Tests No results found for this basename: TSH, T4TOTAL, FREET3, T3FREE, THYROIDAB,  in the last 72 hours  TELE  NSR with minimal ectopy.  ECG  nsr with atrial pacing  Radiology/Studies  Dg Chest Port 1 View  09/14/2014   CLINICAL DATA:  Renal insufficiency. Ischemic cardiomyopathy. Short of breath.  EXAM: PORTABLE CHEST - 1 VIEW  COMPARISON:  None.  FINDINGS: Low volume chest. Low volumes accentuate the pulmonary vascular markings. There is no gross consolidation. The cardiopericardial silhouette is within normal limits.  Two lead LEFT subclavian AICD. LEFT ventricular apex and RIGHT atrial appendage leads.  Diffuse osseous sclerosis compatible with prostate cancer metastases noted from clinic visit 05/10/2014.  IMPRESSION: 1. Low volume chest without gross acute cardiopulmonary disease. Consider repeat PA and lateral with full inspiration when patient condition permits. 2. 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    Electronically Signed   By: Dereck Ligas M.D.   On: 09/19/2014 18:06    ASSESSMENT AND PLAN 1. Recurrent Polymorphic VT 2. S/p multiple ICD shocks 3. Profound hypokalemia, s/p repletion, now low again 4. Ischemic CM, s/p multiple MI's.EF 20% by echo 5. Metastatic prostate CA Rec: he is much improved with repletion of electrolytes. He denies a h/o vomiting or diarrhea. Unclear why potassium stores so low. ABG shows metabolic alkalosis and respiratory acidosis. He is on chronic diuretic therapy and this has been held.  I would anticipate catheterization tomorrow to rule out ischemia as the cause of his VT storm. Will discontinue IV amio and lidocaine today and transition to po amio. Left heart cath today.  Gregg Taylor,M.D.  09/04/2014 7:30 AM

## 2014-09-26 NOTE — Interval H&P Note (Signed)
History and Physical Interval Note:  09/04/2014 4:07 PM  Benjamin Rice  has presented today for cardiac cath with the diagnosis of VT storm, known CAD, ischemic cardiomyopathy.   The various methods of treatment have been discussed with the patient and family. After consideration of risks, benefits and other options for treatment, the patient has consented to  Procedure(s): LEFT HEART CATHETERIZATION WITH CORONARY ANGIOGRAM (N/A) as a surgical intervention .  The patient's history has been reviewed, patient examined, no change in status, stable for surgery.  I have reviewed the patient's chart and labs.  Questions were answered to the patient's satisfaction.    Cath Lab Visit (complete for each Cath Lab visit)  Clinical Evaluation Leading to the Procedure:   ACS: Yes.    Non-ACS:    Anginal Classification: CCS IV  Anti-ischemic medical therapy: Maximal Therapy (2 or more classes of medications)  Non-Invasive Test Results: No non-invasive testing performed  Prior CABG: No previous CABG        Shelagh Rayman

## 2014-09-27 DIAGNOSIS — I472 Ventricular tachycardia: Principal | ICD-10-CM

## 2014-09-27 LAB — BASIC METABOLIC PANEL
ANION GAP: 8 (ref 5–15)
BUN: 23 mg/dL (ref 6–23)
CALCIUM: 7.6 mg/dL — AB (ref 8.4–10.5)
CO2: 36 mEq/L — ABNORMAL HIGH (ref 19–32)
Chloride: 88 mEq/L — ABNORMAL LOW (ref 96–112)
Creatinine, Ser: 1.08 mg/dL (ref 0.50–1.35)
GFR calc non Af Amer: 74 mL/min — ABNORMAL LOW (ref >90)
GFR, EST AFRICAN AMERICAN: 86 mL/min — AB (ref >90)
Glucose, Bld: 93 mg/dL (ref 70–99)
POTASSIUM: 3.4 meq/L — AB (ref 3.7–5.3)
SODIUM: 132 meq/L — AB (ref 137–147)

## 2014-09-27 LAB — NA AND K (SODIUM & POTASSIUM), 24 H UR
POTASSIUM 24H UR: 71 meq/d (ref 25–125)
POTASSIUM UR: 59 meq/L
SODIUM 24H UR: 37 meq/d — AB (ref 40–220)
Sodium, Ur: 31 mEq/L
URINE TOTAL VOLUME-UNAK24: 1200 mL

## 2014-09-27 MED ORDER — SODIUM CHLORIDE 0.9 % IV SOLN: INTRAVENOUS | Status: DC

## 2014-09-27 MED ORDER — INFLUENZA VAC SPLIT QUAD 0.5 ML IM SUSY
0.5000 mL | PREFILLED_SYRINGE | INTRAMUSCULAR | Status: AC
  Administered 2014-09-28: 0.5 mL via INTRAMUSCULAR
  Filled 2014-09-27: qty 0.5

## 2014-09-27 MED ORDER — POTASSIUM CHLORIDE CRYS ER 20 MEQ PO TBCR
40.0000 meq | EXTENDED_RELEASE_TABLET | Freq: Three times a day (TID) | ORAL | Status: DC
  Administered 2014-09-27 (×2): 40 meq via ORAL
  Filled 2014-09-27 (×5): qty 2

## 2014-09-27 MED ORDER — CALCIUM CARBONATE ANTACID 500 MG PO CHEW
400.0000 mg | CHEWABLE_TABLET | Freq: Four times a day (QID) | ORAL | Status: DC | PRN
  Administered 2014-09-27 – 2014-10-01 (×2): 400 mg via ORAL
  Filled 2014-09-27 (×2): qty 2

## 2014-09-27 MED ORDER — METOPROLOL TARTRATE 25 MG PO TABS
25.0000 mg | ORAL_TABLET | Freq: Two times a day (BID) | ORAL | Status: DC
  Filled 2014-09-27 (×2): qty 1

## 2014-09-27 MED ORDER — ALUM & MAG HYDROXIDE-SIMETH 200-200-20 MG/5ML PO SUSP: 30.0000 mL | Freq: Four times a day (QID) | ORAL | Status: DC | PRN

## 2014-09-27 NOTE — Care Management Note (Addendum)
    Page 1 of 1   10/04/2014     9:57:36 AM CARE MANAGEMENT NOTE 10/04/2014  Patient:  Benjamin Rice, Benjamin Rice   Account Number:  [id number]  Date Initiated:  09/27/2014  Documentation initiated by:  Elissa Hefty  Subjective/Objective Assessment:   adm w vtach     Action/Plan:   lives w wife   Anticipated DC Date:     Anticipated DC Plan:           Choice offered to / List presented to:             Status of service:   Medicare Important Message given?  YES (If response is "NO", the following Medicare IM given date fields will be blank) Date Medicare IM given:  10/04/2014 Medicare IM given by:  Elissa Hefty Date Additional Medicare IM given:  10/01/2014 Additional Medicare IM given by:  [facility] Shamonica Schadt  Discharge Disposition:    Per UR Regulation:  Reviewed for med. necessity/level of care/duration of stay  If discussed at [place] of Stay Meetings, dates discussed:   10/02/2014  10/04/2014    Comments:

## 2014-09-27 NOTE — Progress Notes (Signed)
Patient ID: Benjamin Rice, male   DOB: 25-Jan-1956, 57 y.o.   MRN: [id number]   Patient Name: Benjamin Rice Date of Encounter: 09/27/2014     Active Problems:   Ventricular tachycardia, polymorphic    SUBJECTIVE  No chest pain or sob.  CURRENT MEDS . amiodarone  200 mg Oral BID  . antiseptic oral rinse  7 mL Mouth Rinse BID  . aspirin EC  81 mg Oral Daily  . atorvastatin  20 mg Oral q1800  . feeding supplement (RESOURCE BREEZE)  1 Container Oral TID BM  . fentaNYL  100 mcg Transdermal Q72H  . Influenza vac split quadrivalent PF  0.5 mL Intramuscular Tomorrow-1000  . lisinopril  5 mg Oral Daily  . metoprolol  50 mg Oral BID  . potassium chloride  40 mEq Oral BID  . predniSONE  10 mg Oral Daily  . spironolactone  25 mg Oral Daily    OBJECTIVE  Filed Vitals:   09/27/14 0600 09/27/14 0800 09/27/14 1000 09/27/14 1200  BP: 78/52 82/45 72/46  90/47  Pulse: 59 62 65   Temp:  97.8 F (36.6 C)  97.7 F (36.5 C)  TempSrc:  Oral  Oral  Resp: 13 11 15 14   Height:      Weight:      SpO2: 98% 95% 96% 96%    Intake/Output Summary (Last 24 hours) at 09/27/14 1215 Last data filed at 09/27/14 1100  Gross per 24 hour  Intake 1328.95 ml  Output    300 ml  Net 1028.95 ml   Filed Weights   09/13/2014 1600  Weight: 195 lb 15.8 oz (88.9 kg)    PHYSICAL EXAM  General: Pleasant, middle aged man NAD. Neuro: Alert and oriented X 3. Moves all extremities spontaneously. HEENT:  Normal  Neck: Supple without bruits or JVD. Lungs:  Resp regular and unlabored, CTA. Heart: RRR no s3, s4, or murmurs. Abdomen: Soft, non-tender, non-distended, BS + x 4.  Extremities: No clubbing, cyanosis or edema. DP/PT/Radials 2+ and equal bilaterally.  Accessory Clinical Findings  CBC  Recent Labs  09/08/2014 1848  WBC 5.9  HGB 10.4*  HCT 33.6*  MCV 83.2  PLT 347   Basic Metabolic Panel  Recent Labs  09/06/2014 1848  09/25/14 0629 09/07/2014 0445 09/27/14 0340  NA 137  < > 138 136* 132*  K  <2.2*  < > 3.5* 3.3* 3.4*  CL 88*  < > 88* 88* 88*  CO2 38*  < > 43* 39* 36*  GLUCOSE 145*  < > 173* 106* 93  BUN 22  < > 20 22 23   CREATININE 1.02  < > 1.10 1.13 1.08  CALCIUM 8.9  < > 9.0 8.2* 7.6*  MG 1.8  --  2.4  --   --   < > = values in this interval not displayed. Liver Function Tests  Recent Labs  09/02/2014 1848  AST 38*  ALT 19  ALKPHOS 254*  BILITOT 1.3*  PROT 6.1  ALBUMIN 2.4*   No results found for this basename: LIPASE, AMYLASE,  in the last 72 hours Cardiac Enzymes  Recent Labs  09/04/2014 1848 09/12/2014 2325 09/25/14 0629  TROPONINI 0.31* 0.34* <0.30   BNP No components found with this basename: POCBNP,  D-Dimer No results found for this basename: DDIMER,  in the last 72 hours Hemoglobin A1C No results found for this basename: HGBA1C,  in the last 72 hours Fasting Lipid Panel No results found for this basename: CHOL, HDL, LDLCALC,  TRIG, CHOLHDL, LDLDIRECT,  in the last 72 hours Thyroid Function Tests No results found for this basename: TSH, T4TOTAL, FREET3, T3FREE, THYROIDAB,  in the last 72 hours  TELE  nsr  Radiology/Studies  Dg Chest Port 1 View  09/17/2014   CLINICAL DATA:  Renal insufficiency. Ischemic cardiomyopathy. Short of breath.  EXAM: PORTABLE CHEST - 1 VIEW  COMPARISON:  None.  FINDINGS: Low volume chest. Low volumes accentuate the pulmonary vascular markings. There is no gross consolidation. The cardiopericardial silhouette is within normal limits.  Two lead LEFT subclavian AICD. LEFT ventricular apex and RIGHT atrial appendage leads.  Diffuse osseous sclerosis compatible with prostate cancer metastases noted from clinic visit 05/10/2014.  IMPRESSION: 1. Low volume chest without gross acute cardiopulmonary disease. Consider repeat PA and lateral with full inspiration when patient condition permits. 2. 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   Electronically Signed   By: Dereck Ligas M.D.   On: 09/09/2014 18:06     ASSESSMENT AND PLAN  1. VT storm 2. Hypokalemia 3. S/p cath, no obstructive CAD 4. Non-ischemic CM Rec: transfer to tele, replete potassium and continue po amio.and anticipate discharge tomorrow.  Natnael Biederman,M.D.  09/27/2014 12:15 PM

## 2014-09-27 DEATH — deceased

## 2014-09-28 DIAGNOSIS — I472 Ventricular tachycardia: Secondary | ICD-10-CM | POA: Diagnosis not present

## 2014-09-28 LAB — CARBOXYHEMOGLOBIN
CARBOXYHEMOGLOBIN: 1.9 % — AB (ref 0.5–1.5)
Methemoglobin: 1 % (ref 0.0–1.5)
O2 Saturation: 58.5 %
Total hemoglobin: 11.6 g/dL — ABNORMAL LOW (ref 13.5–18.0)

## 2014-09-28 LAB — BASIC METABOLIC PANEL
Anion gap: 7 (ref 5–15)
BUN: 24 mg/dL — AB (ref 6–23)
CO2: 34 mEq/L — ABNORMAL HIGH (ref 19–32)
CREATININE: 1.09 mg/dL (ref 0.50–1.35)
Calcium: 8 mg/dL — ABNORMAL LOW (ref 8.4–10.5)
Chloride: 92 mEq/L — ABNORMAL LOW (ref 96–112)
GFR calc Af Amer: 85 mL/min — ABNORMAL LOW (ref >90)
GFR, EST NON AFRICAN AMERICAN: 73 mL/min — AB (ref >90)
GLUCOSE: 89 mg/dL (ref 70–99)
POTASSIUM: 4.9 meq/L (ref 3.7–5.3)
Sodium: 133 mEq/L — ABNORMAL LOW (ref 137–147)

## 2014-09-28 MED ORDER — OXYCODONE HCL 5 MG PO TABS
10.0000 mg | ORAL_TABLET | ORAL | Status: DC | PRN
  Administered 2014-09-30 – 2014-10-04 (×17): 10 mg via ORAL
  Filled 2014-09-28 (×26): qty 2

## 2014-09-28 MED ORDER — FENTANYL 25 MCG/HR TD PT72
75.0000 ug | MEDICATED_PATCH | TRANSDERMAL | Status: DC
  Administered 2014-09-30 – 2014-10-03 (×2): 75 ug via TRANSDERMAL
  Filled 2014-09-28 (×2): qty 3

## 2014-09-28 MED ORDER — POTASSIUM CHLORIDE CRYS ER 20 MEQ PO TBCR
20.0000 meq | EXTENDED_RELEASE_TABLET | Freq: Two times a day (BID) | ORAL | Status: DC
  Administered 2014-09-28 (×2): 20 meq via ORAL
  Filled 2014-09-28 (×2): qty 1

## 2014-09-28 MED ORDER — DOPAMINE-DEXTROSE 3.2-5 MG/ML-% IV SOLN
2.5000 ug/kg/min | INTRAVENOUS | Status: DC
  Administered 2014-09-28: 5 ug/kg/min via INTRAVENOUS
  Administered 2014-09-28: 3 ug/kg/min via INTRAVENOUS

## 2014-09-28 MED ORDER — LISINOPRIL 2.5 MG PO TABS
2.5000 mg | ORAL_TABLET | Freq: Every day | ORAL | Status: DC
  Filled 2014-09-28: qty 1

## 2014-09-28 MED ORDER — DOPAMINE-DEXTROSE 3.2-5 MG/ML-% IV SOLN
INTRAVENOUS | Status: AC
  Filled 2014-09-28: qty 250

## 2014-09-28 MED ORDER — FUROSEMIDE 10 MG/ML IJ SOLN
40.0000 mg | Freq: Two times a day (BID) | INTRAMUSCULAR | Status: DC
  Filled 2014-09-28: qty 4

## 2014-09-28 MED ORDER — HEPARIN SODIUM (PORCINE) 5000 UNIT/ML IJ SOLN
5000.0000 [IU] | Freq: Three times a day (TID) | INTRAMUSCULAR | Status: DC
Start: 2014-09-28 — End: 2014-10-05
  Administered 2014-09-28 – 2014-10-05 (×21): 5000 [IU] via SUBCUTANEOUS
  Filled 2014-09-28 (×27): qty 1

## 2014-09-28 MED ORDER — NALOXONE HCL 0.4 MG/ML IJ SOLN: 0.2000 mg | INTRAMUSCULAR | Status: DC | PRN

## 2014-09-28 MED ORDER — NALOXONE HCL 0.4 MG/ML IJ SOLN
INTRAMUSCULAR | Status: AC
  Administered 2014-09-28: 15:00:00
  Filled 2014-09-28: qty 1

## 2014-09-28 NOTE — Progress Notes (Signed)
  CTSP due to SBPs in 50s.  58 y/o male with systolic HF EF 79-89%, CAD, metastatic prostate CA admitted with VT storm.   SBPs low today. Ace and b-blocker stopped. Started on dopamine. Was up to 7 mcg/kg/min. Now down to 2. Has not had recurrent ectopy. No f/c.   BP taken personally with Doppler with SBP 82. As he is tolerating, will continue low-dose dopamine for now to keep MAP >= 60. However, if needs higher doses (> 5) would switch to dobutamine or levophed. Will check co-ox.   Benay Spice 8:58 PM

## 2014-09-28 NOTE — Progress Notes (Addendum)
2:15 PM Called to see pt re: hypotension and somnolence  SBP 70s earlier, DA started and now at 7 mcg/kg/min.   SBP 80, MAP 63, HR 87, SR w/ occ PVCs.  Pt very somnolent, rouses to verbal, but will fall asleep after a word or 2.   Pt c/o back pain, otherwise no complaints.  No change in physical exam.  Reviewed data, pt had OxyIR 20 mg, aldactone 25 mg and amiodarone 200 mg at 10:30 am.  Lasix 40 mg IV and lisinopril 2.5 mg held due to borderline BP.  Fentanyl patch was 75 mcg at home, 100 mcg here, applied 10/01 pm.  SBP improved with Trendelenburg and increased DA, but seemed more SOB, likely related to position. Returned to [employer].  Gave Narcan 0.2 mg IV. Pt LOC improved, he became alert, verbal and requested food/drink. SBP now 94.  Will contact MD regarding future doses of OxyIR (home dose is 20 mg q 3 hr prn) and fentanyl, for bone mets.   Will write for Narcan PRN as well. Continue to follow closely.  Rosaria Ferries, PA-C 09/28/2014 2:47 PM Beeper [phone]

## 2014-09-28 NOTE — Progress Notes (Signed)
Pt lethargic, awakens to sound and light touch; unable to keep awake for more than a few minutes at a time; pt with no PO intake of breakfast or lunch; PA Rhonda at bedside; Narcan given per PA orders; pt awakens and states he's "ready to eat some"; BP increasing; pt alert and oriented; will continue to closely monitor

## 2014-09-28 NOTE — Progress Notes (Addendum)
Patient ID: Mohamedamin Nifong, male   DOB: 26-Feb-1956, 58 y.o.   MRN: [id number]   Patient Name: Benjamin Rice Date of Encounter: 09/28/2014     Active Problems:   Ventricular tachycardia, polymorphic    SUBJECTIVE  No chest pain or sob.  CURRENT MEDS . amiodarone  200 mg Oral BID  . antiseptic oral rinse  7 mL Mouth Rinse BID  . aspirin EC  81 mg Oral Daily  . atorvastatin  20 mg Oral q1800  . feeding supplement (RESOURCE BREEZE)  1 Container Oral TID BM  . fentaNYL  100 mcg Transdermal Q72H  . Influenza vac split quadrivalent PF  0.5 mL Intramuscular Tomorrow-1000  . lisinopril  5 mg Oral Daily  . metoprolol  25 mg Oral BID  . potassium chloride  40 mEq Oral TID  . predniSONE  10 mg Oral Daily  . spironolactone  25 mg Oral Daily    OBJECTIVE  Filed Vitals:   09/27/14 2343 09/28/14 0000 09/28/14 0327 09/28/14 0800  BP: 76/58 74/54 83/52  79/55  Pulse: 72  75 85  Temp: 98.4 F (36.9 C)  98.5 F (36.9 C) 98.7 F (37.1 C)  TempSrc: Oral  Oral Oral  Resp: 15 15 16 15   Height:      Weight:      SpO2: 96%  98% 92%    Intake/Output Summary (Last 24 hours) at 09/28/14 0934 Last data filed at 09/28/14 0700  Gross per 24 hour  Intake    430 ml  Output   1300 ml  Net   -870 ml   Filed Weights   09/12/2014 1600  Weight: 195 lb 15.8 oz (88.9 kg)    PHYSICAL EXAM  General: Pleasant, middle aged man NAD. Neuro: Alert and oriented X 3. Moves all extremities spontaneously. HEENT:  Normal  Neck: Supple  Lungs:  Diminished BS bases Heart: RRR + S3 Abdomen: Soft, non-tender, non-distended Extremities: 2+edema.   Accessory Clinical Findings  Basic Metabolic Panel  Recent Labs  09/27/14 0340 09/28/14 0500  NA 132* 133*  K 3.4* 4.9  CL 88* 92*  CO2 36* 34*  GLUCOSE 93 89  BUN 23 24*  CREATININE 1.08 1.09  CALCIUM 7.6* 8.0*    TELE  nsr with PVCs  Radiology/Studies  Dg Chest Port 1 View  09/04/2014   CLINICAL DATA:  Renal insufficiency. Ischemic  cardiomyopathy. Short of breath.  EXAM: PORTABLE CHEST - 1 VIEW  COMPARISON:  None.  FINDINGS: Low volume chest. Low volumes accentuate the pulmonary vascular markings. There is no gross consolidation. The cardiopericardial silhouette is within normal limits.  Two lead LEFT subclavian AICD. LEFT ventricular apex and RIGHT atrial appendage leads.  Diffuse osseous sclerosis compatible with prostate cancer metastases noted from clinic visit 05/10/2014.  IMPRESSION: 1. Low volume chest without gross acute cardiopulmonary disease. Consider repeat PA and lateral with full inspiration when patient condition permits. 2. 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   Electronically Signed   By: Dereck Ligas M.D.   On: 09/18/2014 18:06    ASSESSMENT AND PLAN  1. VT storm- Improved. Most likely secondary to hypokalemia superimposed on nonischemic cardiomyopathy. Continue amiodarone. 2. Hypokalemia-Improved. Change K.Dur to 20 mEq by mouth twice a day. Follow. 3. S/p cath, no obstructive CAD 4. Non-ischemic CM-Patient's blood pressure significantly decreased. Systolic blood pressure in the 70s today. Discontinue metoprolol. Decrease lisinopril to 2.5 mg daily. Follow blood pressure and add low-dose carvedilol later if blood pressure allows. 5.  Acute on chronic systolic CHF-Patient is volume overloaded on examination. Add Lasix 40 mg IV twice a day. Follow renal function and potassium closely. 6. Hypotension-Cardiac medication adjustment as above. Note patient was on pradaxa at home but is unclear why. We need records from his cardiologist in [place]. 09/28/2014 9:34 AM  Contacted by nursing, SBP remains decreased; hold all cardiac meds and diuretics for now; increase dopamine to 7 ug. Patient appears to run low BP. Will resume diuretics and cardiac meds in AM if patient's BP improved. May need milrinone. I have asked physician's assistant to evaluate as I am not in the  hospital. Kirk Ruths 2:30 PM

## 2014-09-29 DIAGNOSIS — R570 Cardiogenic shock: Secondary | ICD-10-CM

## 2014-09-29 DIAGNOSIS — I5023 Acute on chronic systolic (congestive) heart failure: Secondary | ICD-10-CM

## 2014-09-29 DIAGNOSIS — E876 Hypokalemia: Secondary | ICD-10-CM

## 2014-09-29 LAB — BASIC METABOLIC PANEL
Anion gap: 12 (ref 5–15)
BUN: 36 mg/dL — AB (ref 6–23)
CO2: 29 mEq/L (ref 19–32)
CREATININE: 1.49 mg/dL — AB (ref 0.50–1.35)
Calcium: 8.2 mg/dL — ABNORMAL LOW (ref 8.4–10.5)
Chloride: 89 mEq/L — ABNORMAL LOW (ref 96–112)
GFR calc Af Amer: 58 mL/min — ABNORMAL LOW (ref >90)
GFR calc non Af Amer: 50 mL/min — ABNORMAL LOW (ref >90)
GLUCOSE: 114 mg/dL — AB (ref 70–99)
Potassium: 5.2 mEq/L (ref 3.7–5.3)
Sodium: 130 mEq/L — ABNORMAL LOW (ref 137–147)

## 2014-09-29 MED ORDER — SODIUM CHLORIDE 0.9 % IV SOLN
INTRAVENOUS | Status: AC
  Administered 2014-09-29: 17:00:00 via INTRAVENOUS

## 2014-09-29 MED ORDER — SODIUM CHLORIDE 0.9 % IJ SOLN
10.0000 mL | Freq: Two times a day (BID) | INTRAMUSCULAR | Status: DC
  Administered 2014-09-29 – 2014-10-03 (×7): 10 mL

## 2014-09-29 MED ORDER — SODIUM CHLORIDE 0.9 % IJ SOLN: 10.0000 mL | INTRAMUSCULAR | Status: DC | PRN

## 2014-09-29 NOTE — Progress Notes (Signed)
  Patient Name: Benjamin Rice Date of Encounter: 09/29/2014  Active Problems:   Ventricular tachycardia, polymorphic   Length of Stay: 5  SUBJECTIVE  Admitted for polymorphic VT storm in setting of severe nonischemic CMP and hypokalemia. Now with cardiogenic shock and acute systolic HF on (low dose) pressors AICD -St Jude Metastatic prostate Ca  CURRENT MEDS . amiodarone  200 mg Oral BID  . antiseptic oral rinse  7 mL Mouth Rinse BID  . aspirin EC  81 mg Oral Daily  . atorvastatin  20 mg Oral q1800  . feeding supplement (RESOURCE BREEZE)  1 Container Oral TID BM  . [START ON 09/30/2014] fentaNYL  75 mcg Transdermal Q72H  . heparin subcutaneous  5,000 Units Subcutaneous 3 times per day  . predniSONE  10 mg Oral Daily  . sodium chloride  10-40 mL Intracatheter Q12H    OBJECTIVE   Intake/Output Summary (Last 24 hours) at 09/29/14 0950 Last data filed at 09/29/14 0600  Gross per 24 hour  Intake  650.4 ml  Output    775 ml  Net -124.6 ml   Filed Weights   09/05/2014 1600  Weight: 88.9 kg (195 lb 15.8 oz)    PHYSICAL EXAM Filed Vitals:   09/29/14 0600 09/29/14 0700 09/29/14 0800 09/29/14 0900  BP: 65/44 47/27 51/37  68/46  Pulse:      Temp:   98.5 F (36.9 C)   TempSrc:   Oral   Resp: 15 19 25 21   Height:      Weight:      SpO2:       General: Alert, oriented x3, no distress, dry mucous membranes Head: no evidence of trauma, PERRL, EOMI, no exophtalmos or lid lag, no myxedema, no xanthelasma; normal ears, nose and oropharynx Neck: flat jugular venous pulsations and no hepatojugular reflux; brisk carotid pulses without delay and no carotid bruits Chest: clear to auscultation, no signs of consolidation by percussion or palpation, normal fremitus, symmetrical and full respiratory excursions Cardiovascular: laterally displaced apical impulse, regular rhythm, normal first and second heart sounds, no rubs , ?S3, no murmur Abdomen: no tenderness or distention, no masses by  palpation, no abnormal pulsatility or arterial bruits, normal bowel sounds, no hepatosplenomegaly Extremities: no clubbing, cyanosis or edema; 2+ radial, ulnar and brachial pulses bilaterally; 2+ right femoral, posterior tibial and dorsalis pedis pulses; 2+ left femoral, posterior tibial and dorsalis pedis pulses; no subclavian or femoral bruits Neurological: grossly nonfocal  LABS Basic Metabolic Panel  Recent Labs  09/28/14 0500 09/29/14 0319  NA 133* 130*  K 4.9 5.2  CL 92* 89*  CO2 34* 29  GLUCOSE 89 114*  BUN 24* 36*  CREATININE 1.09 1.49*  CALCIUM 8.0* 8.2*  Radiology Studies Imaging results have been reviewed and No results found.  TELE NSR  ECG A paced, V sensed, LAFB  ASSESSMENT AND PLAN  Admitted for polymorphic VT storm in setting of severe nonischemic CMP and hypokalemia. Cardiogenic shock and acute systolic HF on (low dose) pressors AICD -St Jude Metastatic prostate Ca  He is no longer clinically hypervolemic and may be a little dry. DC diuretics (they have been held foor BP anyway). Give a little IV fluids. Try to wean dopamine off. Recheck BNP.   Sanda Klein, MD, [facility] [phone] office [phone] pager 09/29/2014 9:50 AM

## 2014-09-30 DIAGNOSIS — I5043 Acute on chronic combined systolic (congestive) and diastolic (congestive) heart failure: Secondary | ICD-10-CM

## 2014-09-30 DIAGNOSIS — C7951 Secondary malignant neoplasm of bone: Secondary | ICD-10-CM

## 2014-09-30 DIAGNOSIS — C61 Malignant neoplasm of prostate: Secondary | ICD-10-CM

## 2014-09-30 DIAGNOSIS — I428 Other cardiomyopathies: Secondary | ICD-10-CM

## 2014-09-30 DIAGNOSIS — I255 Ischemic cardiomyopathy: Secondary | ICD-10-CM

## 2014-09-30 LAB — BASIC METABOLIC PANEL
ANION GAP: 12 (ref 5–15)
BUN: 53 mg/dL — ABNORMAL HIGH (ref 6–23)
CHLORIDE: 90 meq/L — AB (ref 96–112)
CO2: 27 mEq/L (ref 19–32)
Calcium: 7.6 mg/dL — ABNORMAL LOW (ref 8.4–10.5)
Creatinine, Ser: 1.92 mg/dL — ABNORMAL HIGH (ref 0.50–1.35)
GFR, EST AFRICAN AMERICAN: 43 mL/min — AB (ref >90)
GFR, EST NON AFRICAN AMERICAN: 37 mL/min — AB (ref >90)
Glucose, Bld: 146 mg/dL — ABNORMAL HIGH (ref 70–99)
POTASSIUM: 5 meq/L (ref 3.7–5.3)
SODIUM: 129 meq/L — AB (ref 137–147)

## 2014-09-30 LAB — MAGNESIUM: MAGNESIUM: 2.3 mg/dL (ref 1.5–2.5)

## 2014-09-30 MED ORDER — DOCUSATE SODIUM 100 MG PO CAPS: 100.0000 mg | ORAL_CAPSULE | Freq: Two times a day (BID) | ORAL | Status: DC | PRN

## 2014-09-30 MED ORDER — MEGESTROL ACETATE 40 MG PO TABS
40.0000 mg | ORAL_TABLET | Freq: Every day | ORAL | Status: DC
  Administered 2014-09-30 – 2014-10-04 (×5): 40 mg via ORAL
  Filled 2014-09-30 (×7): qty 1

## 2014-09-30 MED ORDER — ALUM & MAG HYDROXIDE-SIMETH 200-200-20 MG/5ML PO SUSP: 30.0000 mL | Freq: Four times a day (QID) | ORAL | Status: DC | PRN

## 2014-09-30 MED ORDER — POLYETHYLENE GLYCOL 3350 17 G PO PACK
17.0000 g | PACK | Freq: Every day | ORAL | Status: DC | PRN
  Administered 2014-09-30: 17 g via ORAL
  Filled 2014-09-30: qty 1

## 2014-09-30 NOTE — Progress Notes (Signed)
Subjective:  His only complaint is mid back pain. He has moved very little from bed since admission. Appetite is poor and he reportedly has not had a BM in a week. No further arrhythmia Family is worried that he is "stopped up" - apparently they say he has had SBO in the past. They also say that appetite stimulants (Megace by their description) have previously been beneficial.  Objective: AUTOMATIC BP CUFF HAS CONSISTENTLY AND SEVERELY UNDERESTIMATED HIS BP. CHECK MANUAL BP ONLY Temp:  [98.3 F (36.8 C)-98.5 F (36.9 C)] 98.3 F (36.8 C) (10/04 0700) Pulse Rate:  [80-84] 81 (10/04 0700) Resp:  [21-32] 26 (10/04 0700) BP: (88-98)/(59-70) 96/62 mmHg (10/04 0700) SpO2:  [93 %-97 %] 94 % (10/04 0700) Weight change:   Intake/Output from previous day: 10/03 0701 - 10/04 0700 In: 1664.7 [P.O.:1055; I.V.:609.7] Out: 450 [Urine:450]  Intake/Output from this shift: Total I/O In: 320 [P.O.:300; I.V.:20] Out: 200 [Urine:200]  Medications: Current Facility-Administered Medications  Medication Dose Route Frequency Provider Last Rate Last Dose  . 0.9 %  sodium chloride infusion   Intravenous Continuous Evans Lance, MD 10 mL/hr at 09/30/14 0800    . acetaminophen (TYLENOL) tablet 650 mg  650 mg Oral Q4H PRN Almyra Deforest, PA      . alum & mag hydroxide-simeth (MAALOX/MYLANTA) 200-200-20 MG/5ML suspension 30 mL  30 mL Oral Q6H PRN Evans Lance, MD      . amiodarone (PACERONE) tablet 200 mg  200 mg Oral BID Evans Lance, MD   200 mg at 09/29/14 2136  . antiseptic oral rinse (CPC / CETYLPYRIDINIUM CHLORIDE 0.05%) solution 7 mL  7 mL Mouth Rinse BID Evans Lance, MD   7 mL at 09/29/14 2137  . aspirin EC tablet 81 mg  81 mg Oral Daily Almyra Deforest, [place]   81 mg at 09/29/14 1107  . atorvastatin (LIPITOR) tablet 20 mg  20 mg Oral q1800 Evans Lance, MD   20 mg at 09/29/14 1803  . calcium carbonate (TUMS - dosed in mg elemental calcium) chewable tablet 400 mg of elemental calcium  400 mg of  elemental calcium Oral QID PRN Jules Husbands, MD   400 mg of elemental calcium at 09/27/14 0314  . DOPamine (INTROPIN) 800 mg in dextrose 5 % 250 mL (3.2 mg/mL) infusion  2.5 mcg/kg/min Intravenous Titrated Lelon Perla, MD   2 mcg/kg/min at 09/29/14 1400  . feeding supplement (RESOURCE BREEZE) (RESOURCE BREEZE) liquid 1 Container  1 Container Oral TID BM Heather Cornelison Pitts, RD   1 Container at 09/29/14 2000  . fentaNYL (DURAGESIC - dosed mcg/hr) patch 75 mcg  75 mcg Transdermal Q72H Rhonda G Barrett, PA-C      . heparin injection 5,000 Units  5,000 Units Subcutaneous 3 times per day Lelon Perla, MD   5,000 Units at 09/30/14 0549  . LORazepam (ATIVAN) tablet 2 mg  2 mg Oral QHS PRN Romona Curls, RPH   2 mg at 09/27/14 2257  . megestrol (MEGACE) tablet 40 mg  40 mg Oral Daily Marcene Laskowski, MD      . naloxone [facility]) injection 0.2-0.4 mg  0.2-0.4 mg Intravenous PRN Rhonda G Barrett, PA-C      . nitroGLYCERIN (NITROSTAT) SL tablet 0.4 mg  0.4 mg Sublingual Q5 Min x 3 PRN Almyra Deforest, PA      . ondansetron (ZOFRAN) injection 4 mg  4 mg Intravenous Q6H PRN Almyra Deforest, PA   4 mg  at 09/25/14 0218  . oxyCODONE (Oxy IR/ROXICODONE) immediate release tablet 10 mg  10 mg Oral Q4H PRN Evelene Croon Barrett, PA-C   10 mg at 09/30/14 0615  . polyethylene glycol (MIRALAX / GLYCOLAX) packet 17 g  17 g Oral Daily PRN Ha Placeres, MD      . predniSONE (DELTASONE) tablet 10 mg  10 mg Oral Daily Almyra Deforest, [place]   10 mg at 09/29/14 1107  . senna-docusate (Senokot-S) tablet 1 tablet  1 tablet Oral Daily PRN Almyra Deforest, PA   1 tablet at 09/29/14 2136  . sodium chloride 0.9 % injection 10-40 mL  10-40 mL Intracatheter Q12H Rigoberto Noel, MD   10 mL at 09/29/14 2137  . sodium chloride 0.9 % injection 10-40 mL  10-40 mL Intracatheter PRN Rigoberto Noel, MD      . traZODone (DESYREL) tablet 50 mg  50 mg Oral QHS PRN Almyra Deforest, PA   50 mg at 09/27/14 2257    Physical Exam: General: Alert, oriented x3, no distress, dry  mucous membranes  Head: no evidence of trauma, PERRL, EOMI, no exophtalmos or lid lag, no myxedema, no xanthelasma; normal ears, nose and oropharynx  Neck: flat jugular venous pulsations and no hepatojugular reflux; brisk carotid pulses without delay and no carotid bruits  Chest: clear to auscultation, no signs of consolidation by percussion or palpation, normal fremitus, symmetrical and full respiratory excursions  Cardiovascular: laterally displaced apical impulse, regular rhythm, normal first and second heart sounds, no rubs , ?S3, no murmur  Abdomen: no tenderness or distention, no masses by palpation, no abnormal pulsatility or arterial bruits, normal bowel sounds, no hepatosplenomegaly  Extremities: no clubbing, cyanosis or edema; 2+ radial, ulnar and brachial pulses bilaterally; 2+ right femoral, posterior tibial and dorsalis pedis pulses; 2+ left femoral, posterior tibial and dorsalis pedis pulses; no subclavian or femoral bruits  Neurological: grossly nonfocal   Lab Results: Results for orders placed during the hospital encounter of 09/07/2014 (from the past 48 hour(s))  CARBOXYHEMOGLOBIN     Status: Abnormal   Collection Time    09/28/14  9:00 PM      Result Value Ref Range   Total hemoglobin 11.6 (*) 13.5 - 18.0 g/dL   O2 Saturation 58.5     Carboxyhemoglobin 1.9 (*) 0.5 - 1.5 %   Methemoglobin 1.0  0.0 - 1.5 %  BASIC METABOLIC PANEL     Status: Abnormal   Collection Time    09/29/14  3:19 AM      Result Value Ref Range   Sodium 130 (*) 137 - 147 mEq/L   Potassium 5.2  3.7 - 5.3 mEq/L   Chloride 89 (*) 96 - 112 mEq/L   CO2 29  19 - 32 mEq/L   Glucose, Bld 114 (*) 70 - 99 mg/dL   BUN 36 (*) 6 - 23 mg/dL   Creatinine, Ser 1.49 (*) 0.50 - 1.35 mg/dL   Calcium 8.2 (*) 8.4 - 10.5 mg/dL   GFR calc non Af Amer 50 (*) >90 mL/min   GFR calc Af Amer 58 (*) >90 mL/min   Comment: (NOTE)     The eGFR has been calculated using the CKD EPI equation.     This calculation has not been  validated in all clinical situations.     eGFR's persistently <90 mL/min signify possible Chronic Kidney     Disease.   Anion gap 12  5 - 15    Imaging: Imaging results have been  reviewed and No results found.  Assessment:  Active Problems:   Ventricular tachycardia, polymorphic   Prostate cancer metastatic to bone   Acute on chronic combined systolic and diastolic congestive heart failure   Cardiomyopathy, ischemic   Plan:  1. TRANSFER STEPDOWN 2. I think his slow bowel transit is due to immobility, narcotics and acute illness. No signs of SBO by exam. Will try to mobilize and try osmotic laxative 3. Labs pending. Keep K high normal to avoid TdP.  Time Spent Directly with Patient:  Length of Stay:  LOS: 6 days    Sham Alviar 09/30/2014, 10:09 AM

## 2014-10-01 DIAGNOSIS — I5043 Acute on chronic combined systolic (congestive) and diastolic (congestive) heart failure: Secondary | ICD-10-CM

## 2014-10-01 LAB — BASIC METABOLIC PANEL
ANION GAP: 11 (ref 5–15)
BUN: 64 mg/dL — ABNORMAL HIGH (ref 6–23)
CALCIUM: 7.5 mg/dL — AB (ref 8.4–10.5)
CO2: 28 mEq/L (ref 19–32)
Chloride: 89 mEq/L — ABNORMAL LOW (ref 96–112)
Creatinine, Ser: 2.22 mg/dL — ABNORMAL HIGH (ref 0.50–1.35)
GFR calc non Af Amer: 31 mL/min — ABNORMAL LOW (ref >90)
GFR, EST AFRICAN AMERICAN: 36 mL/min — AB (ref >90)
GLUCOSE: 93 mg/dL (ref 70–99)
Potassium: 5.2 mEq/L (ref 3.7–5.3)
SODIUM: 128 meq/L — AB (ref 137–147)

## 2014-10-01 LAB — CARBOXYHEMOGLOBIN
Carboxyhemoglobin: 1.7 % — ABNORMAL HIGH (ref 0.5–1.5)
Methemoglobin: 0.9 % (ref 0.0–1.5)
O2 Saturation: 57.9 %
Total hemoglobin: 10.2 g/dL — ABNORMAL LOW (ref 13.5–18.0)

## 2014-10-01 MED ORDER — SENNA 8.6 MG PO TABS
1.0000 | ORAL_TABLET | Freq: Every day | ORAL | Status: DC
  Administered 2014-10-01 – 2014-10-04 (×4): 8.6 mg via ORAL
  Filled 2014-10-01 (×5): qty 1

## 2014-10-01 MED ORDER — DOPAMINE-DEXTROSE 3.2-5 MG/ML-% IV SOLN
7.5000 ug/kg/min | INTRAVENOUS | Status: DC
  Administered 2014-10-01: 2.5 ug/kg/min via INTRAVENOUS
  Administered 2014-10-02: 7.5 ug/kg/min via INTRAVENOUS
  Filled 2014-10-01 (×2): qty 250

## 2014-10-01 MED ORDER — POLYETHYLENE GLYCOL 3350 17 G PO PACK
17.0000 g | PACK | Freq: Every day | ORAL | Status: DC
  Administered 2014-10-01 – 2014-10-04 (×3): 17 g via ORAL
  Filled 2014-10-01 (×6): qty 1

## 2014-10-01 MED ORDER — DOCUSATE SODIUM 100 MG PO CAPS
100.0000 mg | ORAL_CAPSULE | Freq: Two times a day (BID) | ORAL | Status: DC
  Administered 2014-10-01 – 2014-10-04 (×8): 100 mg via ORAL
  Filled 2014-10-01 (×10): qty 1

## 2014-10-01 NOTE — Progress Notes (Signed)
Dr. Aundra Dubin said OK to hook up CVP line to port-a-cath.

## 2014-10-01 NOTE — Evaluation (Signed)
Physical Therapy Evaluation Patient Details Name: Benjamin Rice MRN: [id number] DOB: 26-Jul-1956 Today's Date: 10/01/2014   History of Present Illness  Pt is a 58 year old African American male with past medical history significant for hypertension, chronic CHF, history of prostatic cancer with bone metastasis, history of CAD status post multiple MI, history of ischemic cardiomyopathy, history of renal insufficiency, status post St. Jude ICD in 2008 for primary prevention present with polymorphic vt storm at [place] and transferred to [facility] for further eval.  Clinical Impression  Pt admitted with the above. Pt currently with functional limitations due to the deficits listed below (see PT Problem List). At the time of PT eval pt was able to perform transfers with assist for balance and safety. Increased cueing required for sequencing and general safety awareness. Pt will benefit from skilled PT to increase their independence and safety with mobility to allow discharge to the venue listed below.       Follow Up Recommendations Home health PT;Supervision/Assistance - 24 hour    Equipment Recommendations  Rolling walker with 5" wheels    Recommendations for Other Services       Precautions / Restrictions Precautions Precautions: Fall Restrictions Weight Bearing Restrictions: No      Mobility  Bed Mobility Overal bed mobility: Needs Assistance Bed Mobility: Supine to Sit     Supine to sit: Min assist     General bed mobility comments: Assist for trunk support as pt transitions to full sitting position EOB.   Transfers Overall transfer level: Needs assistance Equipment used: Rolling walker (2 wheeled) Transfers: Sit to/from [facility] Sit to Stand: Min assist Stand pivot transfers: Min assist       General transfer comment: VC's for hand placement on seated surface for safety. Pt required cues for sequencing and technique. Assist to power-up to full stand  as well as to position RW as he pivots around to the chair.   Ambulation/Gait             General Gait Details: Deferred for safety  Stairs            Wheelchair Mobility    Modified Rankin (Stroke Patients Only)       Balance Overall balance assessment: Needs assistance Sitting-balance support: Feet supported;No upper extremity supported Sitting balance-Leahy Scale: Fair     Standing balance support: Bilateral upper extremity supported;During functional activity Standing balance-Leahy Scale: Poor Standing balance comment: Requires assist                             Pertinent Vitals/Pain Pain Assessment: 0-10 Pain Score: 8  Pain Location: Lower back Pain Intervention(s): Premedicated before session;Monitored during session;Repositioned    Home Living Family/patient expects to be discharged to:: Private residence Living Arrangements: Spouse/significant other;Children Available Help at Discharge: Family;Available 24 hours/day Type of Home: House       Home Layout: One level Home Equipment: Cane - single point      Prior Function Level of Independence: Independent with assistive device(s)         Comments: Pt and daughter state that pt was using the [facility] occasionally, however does not need assist for ADL's     Hand Dominance   Dominant Hand: Right    Extremity/Trunk Assessment   Upper Extremity Assessment: Defer to OT evaluation;Generalized weakness           Lower Extremity Assessment: Overall WFL for tasks assessed  Cervical / Trunk Assessment: Normal  Communication   Communication: No difficulties (Delayed respone)  Cognition Arousal/Alertness: Awake/alert Behavior During Therapy: WFL for tasks assessed/performed Overall Cognitive Status: Impaired/Different from baseline Area of Impairment: Memory;Following commands;Safety/judgement;Problem solving     Memory: Decreased short-term memory Following Commands:  Follows one step commands with increased time Safety/Judgement: Decreased awareness of safety;Decreased awareness of deficits   Problem Solving: Slow processing;Decreased initiation;Difficulty sequencing;Requires verbal cues;Requires tactile cues      General Comments      Exercises        Assessment/Plan    PT Assessment Patient needs continued PT services  PT Diagnosis Difficulty walking;Generalized weakness   PT Problem List Decreased strength;Decreased range of motion;Decreased activity tolerance;Decreased balance;Decreased mobility;Decreased knowledge of use of DME;Decreased safety awareness;Decreased knowledge of precautions  PT Treatment Interventions DME instruction;Gait training;Stair training;Functional mobility training;Therapeutic activities;Therapeutic exercise;Neuromuscular re-education;Patient/family education   PT Goals (Current goals can be found in the Care Plan section) Acute Rehab PT Goals Patient Stated Goal: Pt did not state goals during session. Daughter's goal is for pt to return home PT Goal Formulation: With patient/family Time For Goal Achievement: 10/08/14 Potential to Achieve Goals: Good    Frequency Min 3X/week   Barriers to discharge        Co-evaluation               End of Session Equipment Utilized During Treatment: Gait belt Activity Tolerance: Patient limited by fatigue Patient left: in chair;with call bell/phone within reach;with family/visitor present Nurse Communication: Mobility status         Time: 1125-1200 PT Time Calculation (min): 35 min   Charges:   PT Evaluation $Initial PT Evaluation Tier I: 1 Procedure PT Treatments $Therapeutic Activity: 23-37 mins   PT G Codes:          Rolinda Roan 10/01/2014, 12:53 PM  Rolinda Roan, PT, DPT Acute Rehabilitation Services Pager: [phone]

## 2014-10-01 NOTE — Progress Notes (Addendum)
Patient ID: Benjamin Rice, male   DOB: Jan 04, 1956, 58 y.o.   MRN: [id number]   SUBJECTIVE: Some dyspnea.  Has not been out of bed.  No further VT.  Still constipated.   Scheduled Meds: . amiodarone  200 mg Oral BID  . antiseptic oral rinse  7 mL Mouth Rinse BID  . aspirin EC  81 mg Oral Daily  . atorvastatin  20 mg Oral q1800  . feeding supplement (RESOURCE BREEZE)  1 Container Oral TID BM  . fentaNYL  75 mcg Transdermal Q72H  . heparin subcutaneous  5,000 Units Subcutaneous 3 times per day  . megestrol  40 mg Oral Daily  . predniSONE  10 mg Oral Daily  . sodium chloride  10-40 mL Intracatheter Q12H   Continuous Infusions: . sodium chloride Stopped (10/01/14 0600)  . DOPamine Stopped (09/29/14 1700)   PRN Meds:.acetaminophen, alum & mag hydroxide-simeth, calcium carbonate, LORazepam, naLOXone (NARCAN)  injection, nitroGLYCERIN, ondansetron (ZOFRAN) IV, oxyCODONE, polyethylene glycol, senna-docusate, sodium chloride, traZODone    Filed Vitals:   09/30/14 2230 10/01/14 0000 10/01/14 0400 10/01/14 0800  BP:  88/60 94/60 82/58   Pulse: 79 81 81   Temp:  98.6 F (37 C) 98.2 F (36.8 C) 98 F (36.7 C)  TempSrc:  Oral Oral Oral  Resp: 18 26 24 26   Height:      Weight:      SpO2: 95% 97% 96% 97%    Intake/Output Summary (Last 24 hours) at 10/01/14 1011 Last data filed at 10/01/14 0900  Gross per 24 hour  Intake    785 ml  Output    375 ml  Net    410 ml    LABS: Basic Metabolic Panel:  Recent Labs  09/30/14 0940 10/01/14 0430  NA 129* 128*  K 5.0 5.2  CL 90* 89*  CO2 27 28  GLUCOSE 146* 93  BUN 53* 64*  CREATININE 1.92* 2.22*  CALCIUM 7.6* 7.5*  MG 2.3  --    Liver Function Tests: No results found for this basename: AST, ALT, ALKPHOS, BILITOT, PROT, ALBUMIN,  in the last 72 hours No results found for this basename: LIPASE, AMYLASE,  in the last 72 hours CBC: No results found for this basename: WBC, NEUTROABS, HGB, HCT, MCV, PLT,  in the last 72 hours Cardiac  Enzymes: No results found for this basename: CKTOTAL, CKMB, CKMBINDEX, TROPONINI,  in the last 72 hours BNP: No components found with this basename: POCBNP,  D-Dimer: No results found for this basename: DDIMER,  in the last 72 hours Hemoglobin A1C: No results found for this basename: HGBA1C,  in the last 72 hours Fasting Lipid Panel: No results found for this basename: CHOL, HDL, LDLCALC, TRIG, CHOLHDL, LDLDIRECT,  in the last 72 hours Thyroid Function Tests: No results found for this basename: TSH, T4TOTAL, FREET3, T3FREE, THYROIDAB,  in the last 72 hours Anemia Panel: No results found for this basename: VITAMINB12, FOLATE, FERRITIN, TIBC, IRON, RETICCTPCT,  in the last 72 hours  RADIOLOGY: Dg Chest Port 1 View  09/02/2014   CLINICAL DATA:  Renal insufficiency. Ischemic cardiomyopathy. Short of breath.  EXAM: PORTABLE CHEST - 1 VIEW  COMPARISON:  None.  FINDINGS: Low volume chest. Low volumes accentuate the pulmonary vascular markings. There is no gross consolidation. The cardiopericardial silhouette is within normal limits.  Two lead LEFT subclavian AICD. LEFT ventricular apex and RIGHT atrial appendage leads.  Diffuse osseous sclerosis compatible with prostate cancer metastases noted from clinic visit 05/10/2014.  IMPRESSION:  1. Low volume chest without gross acute cardiopulmonary disease. Consider repeat PA and lateral with full inspiration when patient condition permits. 2. 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   Electronically Signed   By: Dereck Ligas M.D.   On: 08/31/2014 18:06    PHYSICAL EXAM General: NAD Neck: No JVD, no thyromegaly or thyroid nodule.  Lungs: Clear to auscultation bilaterally with normal respiratory effort. CV: Nondisplaced PMI.  Heart regular S1/S2, no S3/S4, 1/6 SEM RUSB.  No peripheral edema.  No carotid bruit.  Normal pedal pulses.  Abdomen: Soft, nontender, no hepatosplenomegaly, no distention.  Neurologic: Alert and oriented  x 3.  Psych: Normal affect. Extremities: No clubbing or cyanosis.   TELEMETRY: Reviewed telemetry pt in NSR  ASSESSMENT AND PLAN: 58 yo with history of CAD s/p MIs in the past, ischemic cardiomyopathy with evidence for low output, prostate cancer with bony mets was admitted with VT storm (PMVT).  1. 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  Started on amiodarone.  LHC showed no obstructive CAD.  Suspect VT related to underlying cardiomyopathy.  - Continue amiodarone.  2. Acute on chronic systolic CHF: Apparently ischemic CMP with history of prior MIs though no obstructive disease on LHC this admission.  He is not volume overloaded at this point but does have evidence for low output with co-ox 58% today and rising creatinine. SBP in 80s. Not good candidate for advanced therapies with metastatic prostate cancer.  - I am going to put him back on dopamine at 2.5 mcg/kg/min today.  Will repeat co-ox.  I am not excited about using milrinone given admission with VT storm.   - When creatinine improves, will start digoxin.  - Will follow CVPs but as above do not think he is volume overloaded.  3. CAD: Apparently has had multiple MIs but did not have significant disease on cath.  Will continue ASA and statin.  4. AKI on CKD: ?cardiorenal versus contrast-induced nephropathy.  Will restart dopamine at low dose as above.  No diuretics.   5. Constipation: Will given laxatives today.  6. Bicuspid aortic valve: Mild AS.   Loralie Champagne 10/01/2014 10:19 AM]

## 2014-10-01 NOTE — Progress Notes (Signed)
NUTRITION FOLLOW-UP  INTERVENTION:  Continue to provide Resource Breeze po TID, each supplement provides 250 kcal and 9 grams of protein  Provide Magic cup TID with meals, each supplement provides 290 kcal and 9 grams of protein  Encouragement of eating during mealtimes is recommended  RD will continue to monitor  NUTRITION DIAGNOSIS: Inadequate oral intake related to N/V as evidenced by per pt report, progressing  Goal: Pt to meet >/= 90% of their estimated nutrition needs, unmet   Monitor:  PO intake, supplement acceptance, weight trends, labs   ASSESSMENT: Pt admitted with polymorphic VT and severe hypokalemia. Pt with hx of metastatic prostate cancer.   Pt is still somewhat confused. Spoke with family regarding eating habits and appetite. Per daughter, pt appetite has improved slightly, now eating around 5 bites of food vs. 2-3 bites. Pt ate all of his oatmeal this AM. PO intake: 25%. Pt states that he is drinking the [employer].  Pt is receiving Megace for appetite.  Per son via telephone, pt needs encouragement to eat during mealtimes. They are concerned that when family is not present, pt will not eat meals. Encouraged pt to drink supplements and to try and eat as much of the protein foods on his tray as possible. RD to send magic cups on trays for extra protein and calories. Pt states that he will try it.    Height: Ht Readings from Last 1 Encounters:  08/29/2014 5\' 10"  (1.778 m)    Weight: Wt Readings from Last 1 Encounters:  09/16/2014 195 lb 15.8 oz (88.9 kg)    Estimated Nutritional Needs: Kcal: 2000-2200 Protein: 100-120 grams Fluid: > 1.5 L/day  Skin: WDL   Diet Order: Cardiac w/ fluid restriction  EDUCATION NEEDS: -No education needs identified at this time   Intake/Output Summary (Last 24 hours) at 10/01/14 0958 Last data filed at 10/01/14 0900  Gross per 24 hour  Intake    795 ml  Output    375 ml  Net    420 ml    Last BM:  9/28  Labs:   Recent Labs Lab 09/26/2014 1848  09/25/14 0629  09/29/14 0319 09/30/14 0940 10/01/14 0430  NA 137  < > 138  < > 130* 129* 128*  K <2.2*  < > 3.5*  < > 5.2 5.0 5.2  CL 88*  < > 88*  < > 89* 90* 89*  CO2 38*  < > 43*  < > 29 27 28   BUN 22  < > 20  < > 36* 53* 64*  CREATININE 1.02  < > 1.10  < > 1.49* 1.92* 2.22*  CALCIUM 8.9  < > 9.0  < > 8.2* 7.6* 7.5*  MG 1.8  --  2.4  --   --  2.3  --   GLUCOSE 145*  < > 173*  < > 114* 146* 93  < > = values in this interval not displayed.  CBG (last 3)  No results found for this basename: GLUCAP,  in the last 72 hours  Scheduled Meds: . amiodarone  200 mg Oral BID  . antiseptic oral rinse  7 mL Mouth Rinse BID  . aspirin EC  81 mg Oral Daily  . atorvastatin  20 mg Oral q1800  . feeding supplement (RESOURCE BREEZE)  1 Container Oral TID BM  . fentaNYL  75 mcg Transdermal Q72H  . heparin subcutaneous  5,000 Units Subcutaneous 3 times per day  . megestrol  40 mg Oral  Daily  . predniSONE  10 mg Oral Daily  . sodium chloride  10-40 mL Intracatheter Q12H    Continuous Infusions: . sodium chloride Stopped (10/01/14 0600)  . DOPamine Stopped (09/29/14 1700)    Past Medical History  Diagnosis Date  . Cancer     prostate CA with bone mets  . Hypertension   . CAD (coronary artery disease)     s/p 5 MI, last cath 2010 per pt in [place] or [place]  . Ischemic cardiomyopathy     s/p St Jude ICD 2008  . Chronic CHF   . Renal insufficiency     Past Surgical History  Procedure Laterality Date  . Hernia repair    . Pacemaker insertion      2008 [place], [place], RD, [place] Licensed Dietitian Nutritionist Pager: [phone]

## 2014-10-01 NOTE — Progress Notes (Signed)
Notified IV therapy that port-a-cath needle due to be changed today.

## 2014-10-01 NOTE — Progress Notes (Signed)
Patient transferred via bed on monitor with belongings to [facility].

## 2014-10-02 ENCOUNTER — Inpatient Hospital Stay (HOSPITAL_COMMUNITY): Payer: Medicare Other

## 2014-10-02 DIAGNOSIS — C61 Malignant neoplasm of prostate: Secondary | ICD-10-CM

## 2014-10-02 DIAGNOSIS — C7951 Secondary malignant neoplasm of bone: Secondary | ICD-10-CM

## 2014-10-02 LAB — BASIC METABOLIC PANEL
ANION GAP: 12 (ref 5–15)
Anion gap: 16 — ABNORMAL HIGH (ref 5–15)
BUN: 73 mg/dL — ABNORMAL HIGH (ref 6–23)
BUN: 83 mg/dL — ABNORMAL HIGH (ref 6–23)
CHLORIDE: 87 meq/L — AB (ref 96–112)
CHLORIDE: 85 meq/L — AB (ref 96–112)
CO2: 27 meq/L (ref 19–32)
CO2: 24 mEq/L (ref 19–32)
Calcium: 7.8 mg/dL — ABNORMAL LOW (ref 8.4–10.5)
Calcium: 7.8 mg/dL — ABNORMAL LOW (ref 8.4–10.5)
Creatinine, Ser: 2.96 mg/dL — ABNORMAL HIGH (ref 0.50–1.35)
Creatinine, Ser: 3.31 mg/dL — ABNORMAL HIGH (ref 0.50–1.35)
GFR calc Af Amer: 25 mL/min — ABNORMAL LOW (ref >90)
GFR calc non Af Amer: 22 mL/min — ABNORMAL LOW (ref >90)
GFR calc non Af Amer: 19 mL/min — ABNORMAL LOW (ref >90)
GFR, EST AFRICAN AMERICAN: 22 mL/min — AB (ref >90)
GLUCOSE: 168 mg/dL — AB (ref 70–99)
Glucose, Bld: 99 mg/dL (ref 70–99)
POTASSIUM: 5.7 meq/L — AB (ref 3.7–5.3)
POTASSIUM: 5.7 meq/L — AB (ref 3.7–5.3)
SODIUM: 126 meq/L — AB (ref 137–147)
Sodium: 125 mEq/L — ABNORMAL LOW (ref 137–147)

## 2014-10-02 LAB — CARBOXYHEMOGLOBIN
Carboxyhemoglobin: 1.7 % — ABNORMAL HIGH (ref 0.5–1.5)
Carboxyhemoglobin: 1.6 % — ABNORMAL HIGH (ref 0.5–1.5)
METHEMOGLOBIN: 0.8 % (ref 0.0–1.5)
Methemoglobin: 1.2 % (ref 0.0–1.5)
O2 SAT: 56.7 %
O2 Saturation: 58.3 %
Total hemoglobin: 10.1 g/dL — ABNORMAL LOW (ref 13.5–18.0)
Total hemoglobin: 10.3 g/dL — ABNORMAL LOW (ref 13.5–18.0)

## 2014-10-02 LAB — CBC
HCT: 30.6 % — ABNORMAL LOW (ref 39.0–52.0)
HEMOGLOBIN: 9.9 g/dL — AB (ref 13.0–17.0)
MCH: 25.6 pg — ABNORMAL LOW (ref 26.0–34.0)
MCHC: 32.4 g/dL (ref 30.0–36.0)
MCV: 79.3 fL (ref 78.0–100.0)
Platelets: 369 10*3/uL (ref 150–400)
RBC: 3.86 MIL/uL — ABNORMAL LOW (ref 4.22–5.81)
RDW: 20.3 % — ABNORMAL HIGH (ref 11.5–15.5)
WBC: 7.2 10*3/uL (ref 4.0–10.5)

## 2014-10-02 MED ORDER — SODIUM CHLORIDE 0.9 % IJ SOLN: 3.0000 mL | Freq: Two times a day (BID) | INTRAMUSCULAR | Status: DC

## 2014-10-02 MED ORDER — SODIUM CHLORIDE 0.9 % IJ SOLN: 3.0000 mL | INTRAMUSCULAR | Status: DC | PRN

## 2014-10-02 MED ORDER — SODIUM CHLORIDE 0.9 % IV SOLN
250.0000 mL | INTRAVENOUS | Status: DC | PRN
  Administered 2014-10-03: 250 mL via INTRAVENOUS

## 2014-10-02 MED ORDER — SODIUM POLYSTYRENE SULFONATE 15 GM/60ML PO SUSP
30.0000 g | Freq: Once | ORAL | Status: AC
  Administered 2014-10-02: 30 g via ORAL
  Filled 2014-10-02: qty 120

## 2014-10-02 MED ORDER — NOREPINEPHRINE BITARTRATE 1 MG/ML IV SOLN
0.0000 ug/min | INTRAVENOUS | Status: DC
  Administered 2014-10-02: 20 ug/min via INTRAVENOUS
  Administered 2014-10-03 – 2014-10-05 (×6): 40 ug/min via INTRAVENOUS
  Filled 2014-10-02 (×9): qty 16

## 2014-10-02 MED ORDER — ASPIRIN 81 MG PO CHEW: 81.0000 mg | CHEWABLE_TABLET | ORAL | Status: DC

## 2014-10-02 MED ORDER — NOREPINEPHRINE BITARTRATE 1 MG/ML IV SOLN
2.0000 ug/min | INTRAVENOUS | Status: DC
  Administered 2014-10-02: 2 ug/min via INTRAVENOUS
  Filled 2014-10-02 (×3): qty 4

## 2014-10-02 NOTE — Progress Notes (Addendum)
Patient ID: Benjamin Rice, male   DOB: 22-Jan-1956, 58 y.o.   MRN: [id number]   SUBJECTIVE: Denies dyspnea at rest.  Not lightheaded but SBP running in the high 70s-80s overnight despite dopamine at 2.5.  Oliguric with creatinine rising.  No fever, WBCs normal.  CVP 8.   Scheduled Meds: . amiodarone  200 mg Oral BID  . antiseptic oral rinse  7 mL Mouth Rinse BID  . aspirin EC  81 mg Oral Daily  . atorvastatin  20 mg Oral q1800  . docusate sodium  100 mg Oral BID  . feeding supplement (RESOURCE BREEZE)  1 Container Oral TID BM  . fentaNYL  75 mcg Transdermal Q72H  . heparin subcutaneous  5,000 Units Subcutaneous 3 times per day  . megestrol  40 mg Oral Daily  . polyethylene glycol  17 g Oral Daily  . predniSONE  10 mg Oral Daily  . senna  1 tablet Oral Daily  . sodium chloride  10-40 mL Intracatheter Q12H   Continuous Infusions: . sodium chloride Stopped (10/01/14 0600)  . DOPamine 5 mcg/kg/min (10/02/14 1033)   PRN Meds:.acetaminophen, alum & mag hydroxide-simeth, calcium carbonate, LORazepam, naLOXone (NARCAN)  injection, nitroGLYCERIN, ondansetron (ZOFRAN) IV, oxyCODONE, polyethylene glycol, senna-docusate, sodium chloride, traZODone    Filed Vitals:   10/02/14 0231 10/02/14 0300 10/02/14 0400 10/02/14 0800  BP:    73/47  Pulse:  75  78  Temp:  97.8 F (36.6 C)  97.6 F (36.4 C)  TempSrc:  Oral  Oral  Resp: 18 21 21 19   Height:      Weight:      SpO2:  92%  92%    Intake/Output Summary (Last 24 hours) at 10/02/14 1051 Last data filed at 10/02/14 0900  Gross per 24 hour  Intake  686.6 ml  Output      0 ml  Net  686.6 ml    LABS: Basic Metabolic Panel:  Recent Labs  09/30/14 0940 10/01/14 0430 10/02/14 0225  NA 129* 128* 126*  K 5.0 5.2 5.7*  CL 90* 89* 87*  CO2 27 28 27   GLUCOSE 146* 93 99  BUN 53* 64* 73*  CREATININE 1.92* 2.22* 2.96*  CALCIUM 7.6* 7.5* 7.8*  MG 2.3  --   --    Liver Function Tests: No results found for this basename: AST, ALT,  ALKPHOS, BILITOT, PROT, ALBUMIN,  in the last 72 hours No results found for this basename: LIPASE, AMYLASE,  in the last 72 hours CBC:  Recent Labs  10/02/14 0225  WBC 7.2  HGB 9.9*  HCT 30.6*  MCV 79.3  PLT 369   Cardiac Enzymes: No results found for this basename: CKTOTAL, CKMB, CKMBINDEX, TROPONINI,  in the last 72 hours BNP: No components found with this basename: POCBNP,  D-Dimer: No results found for this basename: DDIMER,  in the last 72 hours Hemoglobin A1C: No results found for this basename: HGBA1C,  in the last 72 hours Fasting Lipid Panel: No results found for this basename: CHOL, HDL, LDLCALC, TRIG, CHOLHDL, LDLDIRECT,  in the last 72 hours Thyroid Function Tests: No results found for this basename: TSH, T4TOTAL, FREET3, T3FREE, THYROIDAB,  in the last 72 hours Anemia Panel: No results found for this basename: VITAMINB12, FOLATE, FERRITIN, TIBC, IRON, RETICCTPCT,  in the last 72 hours  RADIOLOGY: Dg Chest Port 1 View  08/31/2014   CLINICAL DATA:  Renal insufficiency. Ischemic cardiomyopathy. Short of breath.  EXAM: PORTABLE CHEST - 1 VIEW  COMPARISON:  None.  FINDINGS: Low volume chest. Low volumes accentuate the pulmonary vascular markings. There is no gross consolidation. The cardiopericardial silhouette is within normal limits.  Two lead LEFT subclavian AICD. LEFT ventricular apex and RIGHT atrial appendage leads.  Diffuse osseous sclerosis compatible with prostate cancer metastases noted from clinic visit 05/10/2014.  IMPRESSION: 1. Low volume chest without gross acute cardiopulmonary disease. Consider repeat PA and lateral with full inspiration when patient condition permits. 2. 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   Electronically Signed   By: Dereck Ligas M.D.   On: 09/03/2014 18:06    PHYSICAL EXAM General: NAD Neck: No JVD, no thyromegaly or thyroid nodule.  Lungs: Clear to auscultation bilaterally with normal respiratory  effort. CV: Nondisplaced PMI.  Heart regular S1/S2, no S3/S4, 1/6 SEM RUSB.  1+ edema 1/2 to knees bilaterally.  Abdomen: Soft, nontender, no hepatosplenomegaly, mild distention.  Neurologic: Alert and oriented x 3.  Psych: Normal affect. Extremities: No clubbing or cyanosis.   TELEMETRY: Reviewed telemetry pt in NSR  ASSESSMENT AND PLAN: 58 yo with history of CAD s/p MIs in the past, ischemic cardiomyopathy with evidence for low output, prostate cancer with bony mets was admitted with VT storm (PMVT).  1. 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  Started on amiodarone.  LHC showed no obstructive CAD.  Suspect VT related to underlying cardiomyopathy.  - Continue amiodarone.  2. Acute on chronic systolic CHF: Suspect cardiogenic shock with rising creatinine and low blood pressure (no evidence for sepsis with normal WBCs, afebrile, etc).  Co-ox 58%.  Apparently ischemic cardiomyopathy with history of prior MIs though no obstructive disease on LHC this admission.  Not markedly volume overloaded with CVP 8 today. Not good candidate for advanced therapies with metastatic prostate cancer.  - Increase dopamine to 5 today and place arterial line.  Will repeat co-ox.  I want to avoid using milrinone given admission with VT storm though there is risk from dopamine as well.   - When creatinine improves, will start digoxin.  - I will do a RHC tomorrow to formally assess filling pressure and cardiac output.  3. CAD: Apparently has had multiple MIs but did not have significant disease on cath.  Will continue ASA and statin.  4. AKI on CKD: Oliguric with rising creatinine.  Suspect cardiorenal (versus contrast-induced nephropathy).  Will try to improve cardiac output with dopamine.  Not on renal toxic meds.  Will get renal ultrasound to rule out obstruction/hydronephrosis.  Treat with Kayexalate for K 5.7 and repeat BMET in evening.  5. Constipation: BM yesterday.   6. Bicuspid aortic valve: Mild AS.  7.  Prostate cancer: Metastatic to bones, followed in [place].  Zytiga stopped due to concern that it could potentiate arrhythmias.  Talked to son today, he would like patient seen by heme/onc as inpatient to discuss potential options for treatment.  8. Poor prognosis overall, palliative care/hospice discussions would be appropriate.  Will talk with family tomorrow after RHC.    Loralie Champagne 10/02/2014 10:51 AM]

## 2014-10-02 NOTE — Progress Notes (Signed)
Patient c/o of having no appetite and not being able to tolerate much food.  Family trying to convince patient to eat and becoming upset when RN told patient it is OK to rest and not eat if he does not feel like it.  Family wants patient to "keep up his strength" and does not want patient to have to get a feeding tube.  Patient expressed desire to have feeding tube placed; RN told patient that is a discussion he needs to have with his MD on rounds in the AM.

## 2014-10-02 NOTE — Procedures (Signed)
**Note De-Identified Benjamin Rice Obfuscation** Arterial Catheter Insertion Procedure Note Benjamin Rice [id number] 1956/06/20  Procedure: Insertion of Arterial Catheter  Indications: Blood pressure monitoring and Frequent blood sampling  Procedure Details Consent: Risks of procedure as well as the alternatives and risks of each were explained to the (patient/caregiver).  Consent for procedure obtained. Time Out: Verified patient identification, verified procedure, site/side was marked, verified correct patient position, special equipment/implants available, medications/allergies/relevent history reviewed, required imaging and test results available.  Performed  Maximum sterile technique was used including antiseptics, cap, gloves, gown, hand hygiene, mask and sheet. Skin prep: Chlorhexidine; local anesthetic administered 20 gauge catheter was inserted into right radial artery using the Seldinger technique.  Evaluation Blood flow good; BP tracing good. Complications: No apparent complications.   Narcissus Detwiler, Penni Bombard 10/02/2014

## 2014-10-02 NOTE — Progress Notes (Signed)
Levophed started on pt, report called to receiving RN, Pt transported with all belongings. Safety maintained, bedside report given.

## 2014-10-02 NOTE — Progress Notes (Signed)
Cardiology notified of persistently low BP post increase in Dopa to 80mcg and Radial Aline placement. Pt SBP remains 70's with maps in the 50's. No new orders at this time. Will continue to monitor.

## 2014-10-02 NOTE — Progress Notes (Signed)
    S:  I spoke with patient's son and daughter re: primary oncologist in [place].  His dtr Lavella Lemons [phone]) was able to tell me that pt is followed by Dr. Audree Camel in [place] [phone]).  I called over to Dr. Reynaldo Minium office and left a message for a call back re: discontinuation of Zytiga in the setting of acute CHF and VT storm, and any potential alternative options in the short vs long term.  While making phone calls, I was notified by nurse that despite titration of dopamine to 5 mcg/kg/min, patients BP remains in the 70's.  Dopa was then increased to 7.5 mcg/kg/min without improvement in blood pressure.  Patient is sleeping and arouses easily w/o complaints.  O:   Filed Vitals:   10/02/14 1200  BP: 76/41  Pulse: 74  Temp: 97.6 F (36.4 C)  Resp: 19   Pleasant, sleeping but arouses easily.  Alert and oriented.  Lungs cta, Cor RRR.  A/P:  1.  Hypotension:  Pt with ongoing hypotension despite titration of dopamine.  Discussed with Dr. Aundra Dubin.  Will tx to CCU and initiate levophed @ 2 mcg.  2.  Prostate Cancer:  Zytiga on hold.  Call placed to Dr. Jacquiline Doe for oncology input.  Murray Hodgkins, NP  1:05 PM 10/02/2014

## 2014-10-02 NOTE — Progress Notes (Signed)
MD paged about pt BP 76/48 and coox of 58.3 from this am. No orders at this time, waiting on MD to round on pt.

## 2014-10-03 ENCOUNTER — Inpatient Hospital Stay (HOSPITAL_COMMUNITY): Payer: Medicare Other

## 2014-10-03 ENCOUNTER — Encounter (HOSPITAL_COMMUNITY): Admission: AD | Disposition: E | Payer: Self-pay | Source: Other Acute Inpatient Hospital | Attending: Internal Medicine

## 2014-10-03 DIAGNOSIS — I255 Ischemic cardiomyopathy: Secondary | ICD-10-CM

## 2014-10-03 DIAGNOSIS — Z515 Encounter for palliative care: Secondary | ICD-10-CM

## 2014-10-03 DIAGNOSIS — R0600 Dyspnea, unspecified: Secondary | ICD-10-CM

## 2014-10-03 LAB — URINALYSIS, ROUTINE W REFLEX MICROSCOPIC
Glucose, UA: NEGATIVE mg/dL
Glucose, UA: NEGATIVE mg/dL
Ketones, ur: 15 mg/dL — AB
Ketones, ur: 15 mg/dL — AB
NITRITE: POSITIVE — AB
NITRITE: NEGATIVE
Protein, ur: 100 mg/dL — AB
Protein, ur: 100 mg/dL — AB
SPECIFIC GRAVITY, URINE: 1.019 (ref 1.005–1.030)
Specific Gravity, Urine: 1.017 (ref 1.005–1.030)
Urobilinogen, UA: 1 mg/dL (ref 0.0–1.0)
Urobilinogen, UA: 4 mg/dL — ABNORMAL HIGH (ref 0.0–1.0)
pH: 5 (ref 5.0–8.0)
pH: 5 (ref 5.0–8.0)

## 2014-10-03 LAB — COMPREHENSIVE METABOLIC PANEL
ALT: 99 U/L — AB (ref 0–53)
AST: 394 U/L — AB (ref 0–37)
Albumin: 2 g/dL — ABNORMAL LOW (ref 3.5–5.2)
Alkaline Phosphatase: 426 U/L — ABNORMAL HIGH (ref 39–117)
Anion gap: 22 — ABNORMAL HIGH (ref 5–15)
BUN: 83 mg/dL — ABNORMAL HIGH (ref 6–23)
CALCIUM: 7.5 mg/dL — AB (ref 8.4–10.5)
CO2: 21 mEq/L (ref 19–32)
Chloride: 83 mEq/L — ABNORMAL LOW (ref 96–112)
Creatinine, Ser: 3.29 mg/dL — ABNORMAL HIGH (ref 0.50–1.35)
GFR calc non Af Amer: 19 mL/min — ABNORMAL LOW (ref >90)
GFR, EST AFRICAN AMERICAN: 22 mL/min — AB (ref >90)
Glucose, Bld: 163 mg/dL — ABNORMAL HIGH (ref 70–99)
Potassium: 5.7 mEq/L — ABNORMAL HIGH (ref 3.7–5.3)
SODIUM: 126 meq/L — AB (ref 137–147)
TOTAL PROTEIN: 7 g/dL (ref 6.0–8.3)
Total Bilirubin: 2.6 mg/dL — ABNORMAL HIGH (ref 0.3–1.2)

## 2014-10-03 LAB — BLOOD GAS, ARTERIAL
ACID-BASE DEFICIT: 1.6 mmol/L (ref 0.0–2.0)
BICARBONATE: 21.9 meq/L (ref 20.0–24.0)
DRAWN BY: 39899
O2 CONTENT: 2 L/min
O2 SAT: 93.1 %
PO2 ART: 69.5 mmHg — AB (ref 80.0–100.0)
Patient temperature: 98.6
TCO2: 23 mmol/L (ref 0–100)
pCO2 arterial: 32.9 mmHg — ABNORMAL LOW (ref 35.0–45.0)
pH, Arterial: 7.439 (ref 7.350–7.450)

## 2014-10-03 LAB — BASIC METABOLIC PANEL
Anion gap: 19 — ABNORMAL HIGH (ref 5–15)
BUN: 84 mg/dL — ABNORMAL HIGH (ref 6–23)
CALCIUM: 7.3 mg/dL — AB (ref 8.4–10.5)
CO2: 20 mEq/L (ref 19–32)
Chloride: 86 mEq/L — ABNORMAL LOW (ref 96–112)
Creatinine, Ser: 2.86 mg/dL — ABNORMAL HIGH (ref 0.50–1.35)
GFR calc Af Amer: 26 mL/min — ABNORMAL LOW (ref >90)
GFR, EST NON AFRICAN AMERICAN: 23 mL/min — AB (ref >90)
GLUCOSE: 225 mg/dL — AB (ref 70–99)
Potassium: 5 mEq/L (ref 3.7–5.3)
SODIUM: 125 meq/L — AB (ref 137–147)

## 2014-10-03 LAB — CARBOXYHEMOGLOBIN
Carboxyhemoglobin: 1.5 % (ref 0.5–1.5)
Carboxyhemoglobin: 1.7 % — ABNORMAL HIGH (ref 0.5–1.5)
METHEMOGLOBIN: 0.9 % (ref 0.0–1.5)
Methemoglobin: 0.6 % (ref 0.0–1.5)
O2 SAT: 51.1 %
O2 Saturation: 58.2 %
TOTAL HEMOGLOBIN: 10 g/dL — AB (ref 13.5–18.0)
Total hemoglobin: 9.5 g/dL — ABNORMAL LOW (ref 13.5–18.0)

## 2014-10-03 LAB — URINE MICROSCOPIC-ADD ON

## 2014-10-03 LAB — CBC
HCT: 32.1 % — ABNORMAL LOW (ref 39.0–52.0)
HEMOGLOBIN: 10.4 g/dL — AB (ref 13.0–17.0)
MCH: 25.2 pg — AB (ref 26.0–34.0)
MCHC: 32.4 g/dL (ref 30.0–36.0)
MCV: 77.7 fL — AB (ref 78.0–100.0)
Platelets: 448 10*3/uL — ABNORMAL HIGH (ref 150–400)
RBC: 4.13 MIL/uL — AB (ref 4.22–5.81)
RDW: 20.3 % — ABNORMAL HIGH (ref 11.5–15.5)
WBC: 8.3 10*3/uL (ref 4.0–10.5)

## 2014-10-03 LAB — PROTIME-INR
INR: 1.42 (ref 0.00–1.49)
PROTHROMBIN TIME: 17.4 s — AB (ref 11.6–15.2)

## 2014-10-03 LAB — TSH: TSH: 0.315 u[IU]/mL — ABNORMAL LOW (ref 0.350–4.500)

## 2014-10-03 LAB — MAGNESIUM: MAGNESIUM: 2.6 mg/dL — AB (ref 1.5–2.5)

## 2014-10-03 LAB — GLUCOSE, CAPILLARY: Glucose-Capillary: 192 mg/dL — ABNORMAL HIGH (ref 70–99)

## 2014-10-03 LAB — LACTIC ACID, PLASMA: LACTIC ACID, VENOUS: 1.9 mmol/L (ref 0.5–2.2)

## 2014-10-03 SURGERY — RIGHT HEART CATH
Anesthesia: LOCAL

## 2014-10-03 MED ORDER — SODIUM CHLORIDE 0.9 % IV BOLUS (SEPSIS)
250.0000 mL | Freq: Once | INTRAVENOUS | Status: AC
  Administered 2014-10-03: 250 mL via INTRAVENOUS

## 2014-10-03 MED ORDER — HYDROCORTISONE NA SUCCINATE PF 100 MG IJ SOLR
50.0000 mg | Freq: Four times a day (QID) | INTRAMUSCULAR | Status: DC
  Administered 2014-10-03 – 2014-10-05 (×9): 50 mg via INTRAVENOUS
  Filled 2014-10-03 (×13): qty 1

## 2014-10-03 MED ORDER — FUROSEMIDE 10 MG/ML IJ SOLN
20.0000 mg | Freq: Once | INTRAMUSCULAR | Status: AC
  Administered 2014-10-03: 20 mg via INTRAVENOUS

## 2014-10-03 MED ORDER — VANCOMYCIN HCL 10 G IV SOLR
1750.0000 mg | Freq: Once | INTRAVENOUS | Status: AC
  Administered 2014-10-03: 1750 mg via INTRAVENOUS
  Filled 2014-10-03: qty 1750

## 2014-10-03 MED ORDER — VANCOMYCIN HCL 10 G IV SOLR
1500.0000 mg | INTRAVENOUS | Status: DC
  Administered 2014-10-05: 1500 mg via INTRAVENOUS
  Filled 2014-10-03: qty 1500

## 2014-10-03 MED ORDER — FUROSEMIDE 10 MG/ML IJ SOLN
INTRAMUSCULAR | Status: AC
  Filled 2014-10-03: qty 2

## 2014-10-03 MED ORDER — PIPERACILLIN-TAZOBACTAM 3.375 G IVPB
3.3750 g | Freq: Three times a day (TID) | INTRAVENOUS | Status: DC
  Administered 2014-10-03 – 2014-10-05 (×7): 3.375 g via INTRAVENOUS
  Filled 2014-10-03 (×9): qty 50

## 2014-10-03 MED ORDER — VASOPRESSIN 20 UNIT/ML IJ SOLN
0.0300 [IU]/min | INTRAVENOUS | Status: DC
  Administered 2014-10-04 – 2014-10-05 (×3): 0.03 [IU]/min via INTRAVENOUS
  Filled 2014-10-03 (×3): qty 2

## 2014-10-03 MED ORDER — SODIUM POLYSTYRENE SULFONATE 15 GM/60ML PO SUSP
30.0000 g | Freq: Once | ORAL | Status: AC
  Administered 2014-10-03: 30 g via ORAL
  Filled 2014-10-03: qty 120

## 2014-10-03 MED ORDER — DOPAMINE-DEXTROSE 3.2-5 MG/ML-% IV SOLN
2.0000 ug/kg/min | INTRAVENOUS | Status: DC
  Administered 2014-10-03: 5 ug/kg/min via INTRAVENOUS
  Filled 2014-10-03: qty 250

## 2014-10-03 NOTE — Significant Event (Signed)
Progressive pressor requirement with decrease in UOP noted.  Levo increased from 20 to 23mcg/min Lasix 20mg  IV given with 100cc UOP over 3 hours. Despite increasing DPA from 7.5 to 10, MAPs continue to drop Co-Ox stable at 58.2 despite increase in afterload Remains afebrile, WBC 8.3 CVP remains steady at 8  Impression Worsened shock, suspect worsening vasodilatory component, possibly due to infection and/or adrenal insufficiency in setting of chronic prednisone use  Plan Change DPA to titratable Infectious w/u: blood and urine cultures and CXR Stress dose steroids: hydrocort 50mg  q6h Abx: vanc/zosyn CVP of 8 likely too low in setting of distributive shock: 250cc fluid bolus trial

## 2014-10-03 NOTE — Progress Notes (Signed)
MD notified about BP in the 19E systolic. Increased Dopamine to 10 mcg. Levo is at 40 mcg. Pt is A&O x4. MD ordered for titratable Dopamine and ordered Vanc and Zosyn. Will continue to assess and monitor the pt closely.

## 2014-10-03 NOTE — Progress Notes (Signed)
Pt has had a total of 125cc UOP since 1900. I notified MD. And 20mg  IV lasix was ordered. Given and will continue to assess and monitor.

## 2014-10-03 NOTE — Consult Note (Signed)
Patient Benjamin Rice      DOB: [birth year]      UIV:[id number]  Summary of Goals of Care; full note to follow:  Met with Patient's Wife Benjamin Rice and Daughter Benjamin Rice; also spoke by phone with Benjamin Rice who is on the way from [place]. All will being staying over night .  Daughter Benjamin Rice with some medical knowledge from being a care giver.  Family aware of diagnosis and able to tell me back all that is going wrong. Dilemma is that they have a faith base that is directing to do everything and they relate that Benjamin Rice has told them so although their is some controversy over what he has said about feeding tubes. They were able to tell me that his cancer has likely spread despite treatment but are unwilling to admit that he could die despite all information given to them by all the doctors.  They are requesting continued full care and full code but are willing to take it one day at a time.  Spouse specifically stated she would want him intubated if needed. [name] is somewhat confused and likely not with full capacity.  When asked what he believes about all this . He states he doesn't want to know anything , he will do whatever they say to do.   Recommend:  Continue to treat refractory shock.  They are willing to talk more tomorrow around 8 am and reassess his situation.   Full code.   Total time: 100 pm- 230 pm   Benjamin Dias L. Lovena Le, MD MBA The Palliative Medicine Team at [facility] Phone: [phone] Pager: [phone] ( Use team phone after hours)

## 2014-10-03 NOTE — Progress Notes (Signed)
ANTIBIOTIC CONSULT NOTE - INITIAL  Pharmacy Consult for Vancomycin and Zosyn  Indication: rule out sepsis  No Known Allergies  Patient Measurements: Height: 5\' 10"  (177.8 cm) Weight: 217 lb 6 oz (98.6 kg) IBW/kg (Calculated) : 73  Vital Signs: Temp: 98 F (36.7 C) (10/07 0300) Temp Source: Oral (10/07 0300) BP: 97/60 mmHg (10/07 0500) Pulse Rate: 102 (10/07 0500) Intake/Output from previous day: 10/06 0701 - 10/07 0700 In: 929.4 [P.O.:60; I.V.:869.4] Out: 335 [Urine:335] Intake/Output from this shift: Total I/O In: 449.9 [I.V.:449.9] Out: 225 [Urine:225]  Labs:  Recent Labs  10/02/14 0225 10/02/14 1725 10/22/2014 0330  WBC 7.2  --  8.3  HGB 9.9*  --  10.4*  PLT 369  --  448*  CREATININE 2.96* 3.31* 3.29*   Estimated Creatinine Clearance: 28.8 ml/min (by C-G formula based on Cr of 3.29). No results found for this basename: VANCOTROUGH, Corlis Leak, VANCORANDOM, [place], [place], [place], [place], TOBRAPEAK, TOBRARND, AMIKACINPEAK, AMIKACINTROU, AMIKACIN,  in the last 72 hours   Microbiology: Recent Results (from the past 720 hour(s))  MRSA PCR SCREENING     Status: None   Collection Time    09/03/2014  4:41 PM      Result Value Ref Range Status   MRSA by PCR NEGATIVE  NEGATIVE Final   Comment:            The GeneXpert MRSA Assay (FDA     approved for NASAL specimens     only), is one component of a     comprehensive MRSA colonization     surveillance program. It is not     intended to diagnose MRSA     infection nor to guide or     monitor treatment for     MRSA infections.    Medical History: Past Medical History  Diagnosis Date  . Cancer     prostate CA with bone mets  . Hypertension   . CAD (coronary artery disease)     s/p 5 MI, last cath 2010 per pt in [place] or [place]  . Ischemic cardiomyopathy     s/p St Jude ICD 2008  . Chronic CHF   . Renal insufficiency     Medications:  Scheduled:  . amiodarone  200 mg Oral BID  .  antiseptic oral rinse  7 mL Mouth Rinse BID  . aspirin  81 mg Oral Pre-Cath  . aspirin EC  81 mg Oral Daily  . atorvastatin  20 mg Oral q1800  . docusate sodium  100 mg Oral BID  . feeding supplement (RESOURCE BREEZE)  1 Container Oral TID BM  . fentaNYL  75 mcg Transdermal Q72H  . heparin subcutaneous  5,000 Units Subcutaneous 3 times per day  . megestrol  40 mg Oral Daily  . polyethylene glycol  17 g Oral Daily  . predniSONE  10 mg Oral Daily  . senna  1 tablet Oral Daily  . sodium chloride  10-40 mL Intracatheter Q12H  . sodium chloride  3 mL Intravenous Q12H   Assessment: 58 yo male with persistent hypotension, possible sepsis, for empiric antibiotics  Goal of Therapy:  Vancomycin trough level 15-20 mcg/ml  Plan:  Vancomycin 1750 mg IV now, then 1500 mg IV q48h Zosyn 3.375 g IV q8h   Nneka Blanda, Bronson Curb 10/13/2014,5:38 AM
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
## 2014-10-03 NOTE — Progress Notes (Addendum)
Patient ID: Benjamin Rice, male   DOB: 12-Mar-1956, 58 y.o.   MRN: [id number]   SUBJECTIVE: Progressive need for pressors overnight.  Creatinine up to 3.29 this morning, poor UOP.  CVP 8.  No fever.    Scheduled Meds: . amiodarone  200 mg Oral BID  . antiseptic oral rinse  7 mL Mouth Rinse BID  . aspirin EC  81 mg Oral Daily  . atorvastatin  20 mg Oral q1800  . docusate sodium  100 mg Oral BID  . feeding supplement (RESOURCE BREEZE)  1 Container Oral TID BM  . fentaNYL  75 mcg Transdermal Q72H  . heparin subcutaneous  5,000 Units Subcutaneous 3 times per day  . hydrocortisone sodium succinate  50 mg Intravenous 4 times per day  . megestrol  40 mg Oral Daily  . piperacillin-tazobactam (ZOSYN)  IV  3.375 g Intravenous 3 times per day  . polyethylene glycol  17 g Oral Daily  . senna  1 tablet Oral Daily  . sodium chloride  10-40 mL Intracatheter Q12H  . sodium chloride  3 mL Intravenous Q12H  . [START ON 10-11-14] vancomycin  1,500 mg Intravenous Q48H  . vancomycin  1,750 mg Intravenous Once   Continuous Infusions: . sodium chloride Stopped (09/28/2014 0200)  . DOPamine 20 mcg/kg/min (09/27/2014 0616)  . norepinephrine (LEVOPHED) Adult infusion 40 mcg/min (10/21/2014 0530)   PRN Meds:.sodium chloride, acetaminophen, alum & mag hydroxide-simeth, calcium carbonate, LORazepam, naLOXone (NARCAN)  injection, nitroGLYCERIN, ondansetron (ZOFRAN) IV, oxyCODONE, polyethylene glycol, senna-docusate, sodium chloride, sodium chloride, traZODone    Filed Vitals:   10/04/2014 0400 10/18/2014 0500 10/20/2014 0600 10/20/2014 0746  BP: 81/55 97/60 100/63 92/65  Pulse: 96 102 117 110  Temp:    98.1 F (36.7 C)  TempSrc:    Oral  Resp: 33 26 29 25   Height:      Weight:      SpO2: 100% 96% 97% 99%    Intake/Output Summary (Last 24 hours) at 10/09/2014 0753 Last data filed at 09/29/2014 0600  Gross per 24 hour  Intake    986 ml  Output    335 ml  Net    651 ml    LABS: Basic Metabolic Panel:  Recent  Labs  09/30/14 0940  10/02/14 1725 09/28/2014 0330  NA 129*  < > 125* 126*  K 5.0  < > 5.7* 5.7*  CL 90*  < > 85* 83*  CO2 27  < > 24 21  GLUCOSE 146*  < > 168* 163*  BUN 53*  < > 83* 83*  CREATININE 1.92*  < > 3.31* 3.29*  CALCIUM 7.6*  < > 7.8* 7.5*  MG 2.3  --   --  2.6*  < > = values in this interval not displayed. Liver Function Tests:  Recent Labs  10/22/2014 0330  AST 394*  ALT 99*  ALKPHOS 426*  BILITOT 2.6*  PROT 7.0  ALBUMIN 2.0*   No results found for this basename: LIPASE, AMYLASE,  in the last 72 hours CBC:  Recent Labs  10/02/14 0225 10/10/2014 0330  WBC 7.2 8.3  HGB 9.9* 10.4*  HCT 30.6* 32.1*  MCV 79.3 77.7*  PLT 369 448*   Cardiac Enzymes: No results found for this basename: CKTOTAL, CKMB, CKMBINDEX, TROPONINI,  in the last 72 hours BNP: No components found with this basename: POCBNP,  D-Dimer: No results found for this basename: DDIMER,  in the last 72 hours Hemoglobin A1C: No results found for this basename:  HGBA1C,  in the last 72 hours Fasting Lipid Panel: No results found for this basename: CHOL, HDL, LDLCALC, TRIG, CHOLHDL, LDLDIRECT,  in the last 72 hours Thyroid Function Tests: No results found for this basename: TSH, T4TOTAL, FREET3, T3FREE, THYROIDAB,  in the last 72 hours Anemia Panel: No results found for this basename: VITAMINB12, FOLATE, FERRITIN, TIBC, IRON, RETICCTPCT,  in the last 72 hours  RADIOLOGY: Dg Chest Port 1 View  09/16/2014   CLINICAL DATA:  Renal insufficiency. Ischemic cardiomyopathy. Short of breath.  EXAM: PORTABLE CHEST - 1 VIEW  COMPARISON:  None.  FINDINGS: Low volume chest. Low volumes accentuate the pulmonary vascular markings. There is no gross consolidation. The cardiopericardial silhouette is within normal limits.  Two lead LEFT subclavian AICD. LEFT ventricular apex and RIGHT atrial appendage leads.  Diffuse osseous sclerosis compatible with prostate cancer metastases noted from clinic visit 05/10/2014.   IMPRESSION: 1. Low volume chest without gross acute cardiopulmonary disease. Consider repeat PA and lateral with full inspiration when patient condition permits. 2. 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   Electronically Signed   By: Dereck Ligas M.D.   On: 09/23/2014 18:06    PHYSICAL EXAM General: NAD Neck: No JVD, no thyromegaly or thyroid nodule.  Lungs: Clear to auscultation bilaterally with normal respiratory effort. CV: Nondisplaced PMI.  Heart regular S1/S2, no S3/S4, 1/6 SEM RUSB.  1+ edema 1/2 to knees bilaterally.  Abdomen: Soft, nontender, no hepatosplenomegaly, mild distention.  Neurologic: Alert and oriented x 3.  Psych: Normal affect. Extremities: No clubbing or cyanosis.   TELEMETRY: Reviewed telemetry pt in NSR  ASSESSMENT AND PLAN: 58 yo with history of CAD s/p MIs in the past, ischemic cardiomyopathy with evidence for low output, prostate cancer with bony mets was admitted with VT storm (PMVT).  1. 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  Started on amiodarone.  LHC showed no obstructive CAD.  Suspect VT related to underlying cardiomyopathy.  - Continue amiodarone.  2. Acute on chronic systolic CHF: EF 25-42%.  Apparently ischemic cardiomyopathy with history of prior MIs though no obstructive disease on LHC this admission.   3. Shock: Progressive shock over the last day.  Component of cardiogenic shock but concerned for vasodilatory component.  Co-ox 58% this morning.  CVP 8.  - Dopamine now at 20, concerned given history of VT storm.  Will try to lower this and use other pressors preferentially. - Continue norepinephrine, can add vasopressin.  - On stress dose steroids. - Pancultured and on broad spectrum abx for possible infection.  - Hold off on RHC.  4. CAD: Apparently has had multiple MIs but did not have significant disease on cath.  Will continue ASA and statin.  5. AKI on CKD: Oliguric with rising creatinine.  Suspect related  to shock (with possible component of contrast nephropathy from cath).  No hydronephrosis on renal US. Treat with Kayexalate for K 5.7 and repeat BMET in evening.  Not good HD candidate.  6. Bicuspid aortic valve: Mild AS.  7. 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  Zytiga stopped due to concern that it could potentiate arrhythmias.   8. Poor prognosis overall, I have asked palliative care to talk to family today.  I discussed with daughter and wife.   Loralie Champagne 09/28/2014 7:53 AM]

## 2014-10-03 NOTE — Progress Notes (Signed)
Family member entered spiritual care office very teary-eyed. Suggested that mother may like a Chaplain visit. Chaplain went to visit the family and prayed with patient and family. Informed that most of the pt's children will be present this afternoon.  Chaplain will follow-up later.  Delford Field, [place] 10/14/2014 12:01 PM  10/12/2014 1100  Clinical Encounter Type  Visited With Patient and family together  Visit Type Follow-up;Spiritual support  Referral From Family  Consult/Referral To Chaplain  Spiritual Encounters  Spiritual Needs Prayer  Stress Factors  Family Stress Factors None identified

## 2014-10-03 NOTE — Consult Note (Signed)
Patient NW:GNFAO Stadler      DOB: 25-Feb-1956      ZHY:[id number]     Consult Note from the Palliative Medicine Team at [facility] Requested by: Dr. Titus Mould     PCP: No primary provider on file. Reason for Consultation: [place]     Phone Number:None Related symptom recommendation Assessment of patients Current state:58 yr old with past medical history for metastatic prostate cancer, transferred from [place] with VT storm.  He has a known cardiomyopathy with ICD.   He has been hypotensive which is felt to be secondary to possible sepsis.  Rudell is confused at this time but I met with his wife , and daughter and talked with is son who is on his way from [place].  The family has head knowledge about all of his conditions but feel that he has weathered worse.  There "heart knowledge and deep faith in God's power" is making it hard for them to be objective about his current decline.  Currently, his wife is requesting intubation and continued aggressive treatment for his septic shock. She is willing to reassess daily and if Gurkirat can contribute she would like to hear what he has to say.  Today when asked if he would like information and what his thoughts were he stated he did not want to know and to do what his family states.   Goals of Care: 1.  Code Status: Full code   2. Scope of Treatment: Continue full curative treatement with pressors, cpr, blood products and antibiotics.   4. Disposition: to be determined. Patient is sick enough for an in hospital death.   3. Symptom Management:   1. Anxiety/Agitation: currently receiving prn ativan at bedtime, may need to titrate for use during the day if anxiety becomes an issue. 2. Pain: Fentanyl patch has been applied, monitor may cause excessive sedation in the next 24-48 hours. 3. Bowel Regimen:monitor 10/5 4. Delirium: high risk already slightly confused to time . Monitor 5. Fever: prn tylenol  4. Psychosocial: Patient  is married with a daughter and son.   5. Spiritual: Christian faith        Patient Documents Completed or Given: Document Given Completed  Advanced Directives Pkt    MOST    DNR    Gone from My Sight    Hard Choices      Brief HPI: 58 yr old transferred from [facility] with VT storm.  Patient has been battling metastatic prostate cancer presents with persistent ICD firing and is being treated for VT storm. We were asked to assist goals of care as the patient is not responding favorably to current standard treatments.   ROS: shortness of breath,  Generalized pain, anorexia, no nausea or vomiting    PMH:  Past Medical History  Diagnosis Date  . Cancer     prostate CA with bone mets  . Hypertension   . CAD (coronary artery disease)     s/p 5 MI, last cath 2010 per pt in [place] or   . Ischemic cardiomyopathy     s/p St Jude ICD 2008  . Chronic CHF   . Renal insufficiency      PSH: Past Surgical History  Procedure Laterality Date  . Hernia repair    . Pacemaker insertion      2008 St jude   I have reviewed the FH and SH and  If appropriate update it with new information. No Known Allergies Scheduled Meds: .  amiodarone  200 mg Oral BID  . antiseptic oral rinse  7 mL Mouth Rinse BID  . aspirin EC  81 mg Oral Daily  . atorvastatin  20 mg Oral q1800  . docusate sodium  100 mg Oral BID  . feeding supplement (RESOURCE BREEZE)  1 Container Oral TID BM  . fentaNYL  75 mcg Transdermal Q72H  . heparin subcutaneous  5,000 Units Subcutaneous 3 times per day  . hydrocortisone sodium succinate  50 mg Intravenous 4 times per day  . megestrol  40 mg Oral Daily  . piperacillin-tazobactam (ZOSYN)  IV  3.375 g Intravenous 3 times per day  . polyethylene glycol  17 g Oral Daily  . senna  1 tablet Oral Daily  . sodium chloride  10-40 mL Intracatheter Q12H  . sodium chloride  3 mL Intravenous Q12H  . [START ON 2014/11/04] vancomycin  1,500 mg Intravenous Q48H    Continuous Infusions: . sodium chloride Stopped (10/25/2014 0200)  . DOPamine 5 mcg/kg/min (10/04/2014 1403)  . norepinephrine (LEVOPHED) Adult infusion 40 mcg/min (09/28/2014 0530)  . vasopressin (PITRESSIN) infusion - *FOR SHOCK* 0.03 Units/min (10/11/2014 1100)   PRN Meds:.sodium chloride, acetaminophen, alum & mag hydroxide-simeth, calcium carbonate, LORazepam, naLOXone (NARCAN)  injection, nitroGLYCERIN, ondansetron (ZOFRAN) IV, oxyCODONE, polyethylene glycol, senna-docusate, sodium chloride, sodium chloride, traZODone    BP 99/64  Pulse 96  Temp(Src) 97.7 F (36.5 C) (Oral)  Resp 27  Ht _0  (1.778 m)  Wt 98.6 kg (217 lb 6 oz)  BMI 31.19 kg/m2  SpO2 97%   PPS: 10%   Intake/Output Summary (Last 24 hours) at 10/17/2014 1514 Last data filed at 09/30/2014 1100  Gross per 24 hour  Intake 1742.47 ml  Output    460 ml  Net 1282.47 ml   LBM: 10/5                       Stool [place]: in place  Physical Exam:  General: awake, alert, but confabulatory, oriented to self and family but not time and with no insight into current severity of illness HEENT:  PERRL , EOMI anictieric, mmm Chest:   Decreased with basilar rhonchi CVS: regular but tachy, S1, S2 Abdomen: soft , diffuse tender slightly distended Ext: cool and clammy, Neuro:oriented to self and family, otherwise slow cognition with confabulation and no insight or judgement related to severity of illness  Labs: CBC    Component Value Date/Time   WBC 8.3 09/29/2014 0330   RBC 4.13* 10/27/2014 0330   HGB 10.4* 10/02/2014 0330   HCT 32.1* 09/27/2014 0330   PLT 448* 10/11/2014 0330   MCV 77.7* 10/15/2014 0330   MCH 25.2* 10/25/2014 0330   MCHC 32.4 10/23/2014 0330   RDW 20.3* 10/08/2014 0330   LYMPHSABS 0.8 06/05/2013 1431   MONOABS 0.5 06/05/2013 1431   EOSABS 0.1 06/05/2013 1431   BASOSABS 0.0 06/05/2013 1431      CMP     Component Value Date/Time   NA 126* 09/30/2014 0330   K 5.7* 10/20/2014 0330   CL 83* 10/27/2014 0330   CO2  21 10/24/2014 0330   GLUCOSE 163* 10/02/2014 0330   BUN 83* 10/15/2014 0330   CREATININE 3.29* 10/01/2014 0330   CALCIUM 7.5* 10/24/2014 0330   PROT 7.0 10/24/2014 0330   ALBUMIN 2.0* 10/16/2014 0330   AST 394* 10/19/2014 0330   ALT 99* 10/25/2014 0330   ALKPHOS 426* 10/21/2014 0330   BILITOT 2.6* 10/24/2014 0330   GFRNONAA  19* 10/13/2014 0330   GFRAA 22* 09/28/2014 0330    Chest Xray Reviewed/Impressions:Worsening bibasilar atelectasis.    Time In Time Out Total Time Spent with Patient Total Overall Time  100 pm 230 pm 30 min 90 min    Greater than 50%  of this time was spent counseling and coordinating care related to the above assessment and plan.  Dominique Calvey L. Lovena Le, MD MBA The Palliative Medicine Team at [facility] Phone: [phone] Pager: [phone] ( Use team phone after hours)

## 2014-10-03 NOTE — Progress Notes (Signed)
PT Cancellation Note  Patient Details Name: Benjamin Rice MRN: [id number] DOB: 05/18/1956   Cancelled Treatment:    Reason Eval/Treat Not Completed: Patient at procedure or test/unavailable. Pt with staff and family present in room. Pt for Palliative consult this afternoon. Will continue to follow and progress as able per POC.   Rolinda Roan 10/18/2014, 3:12 PM  Rolinda Roan, PT, DPT Acute Rehabilitation Services Pager: [phone]

## 2014-10-04 ENCOUNTER — Inpatient Hospital Stay (HOSPITAL_COMMUNITY): Payer: Medicare Other

## 2014-10-04 LAB — CBC
HEMATOCRIT: 31.5 % — AB (ref 39.0–52.0)
HEMOGLOBIN: 10.2 g/dL — AB (ref 13.0–17.0)
MCH: 25.8 pg — ABNORMAL LOW (ref 26.0–34.0)
MCHC: 32.4 g/dL (ref 30.0–36.0)
MCV: 79.5 fL (ref 78.0–100.0)
Platelets: 450 10*3/uL — ABNORMAL HIGH (ref 150–400)
RBC: 3.96 MIL/uL — ABNORMAL LOW (ref 4.22–5.81)
RDW: 20.5 % — AB (ref 11.5–15.5)
WBC: 11.3 10*3/uL — AB (ref 4.0–10.5)

## 2014-10-04 LAB — BASIC METABOLIC PANEL
ANION GAP: 20 — AB (ref 5–15)
Anion gap: 22 — ABNORMAL HIGH (ref 5–15)
BUN: 88 mg/dL — ABNORMAL HIGH (ref 6–23)
BUN: 90 mg/dL — AB (ref 6–23)
CALCIUM: 7.5 mg/dL — AB (ref 8.4–10.5)
CHLORIDE: 86 meq/L — AB (ref 96–112)
CO2: 18 meq/L — AB (ref 19–32)
CO2: 21 mEq/L (ref 19–32)
CREATININE: 3.07 mg/dL — AB (ref 0.50–1.35)
CREATININE: 2.91 mg/dL — AB (ref 0.50–1.35)
Calcium: 7.1 mg/dL — ABNORMAL LOW (ref 8.4–10.5)
Chloride: 85 mEq/L — ABNORMAL LOW (ref 96–112)
GFR calc Af Amer: 24 mL/min — ABNORMAL LOW (ref >90)
GFR calc Af Amer: 26 mL/min — ABNORMAL LOW (ref >90)
GFR calc non Af Amer: 21 mL/min — ABNORMAL LOW (ref >90)
GFR calc non Af Amer: 22 mL/min — ABNORMAL LOW (ref >90)
GLUCOSE: 161 mg/dL — AB (ref 70–99)
Glucose, Bld: 229 mg/dL — ABNORMAL HIGH (ref 70–99)
Potassium: 5.5 mEq/L — ABNORMAL HIGH (ref 3.7–5.3)
Potassium: 5.1 mEq/L (ref 3.7–5.3)
Sodium: 126 mEq/L — ABNORMAL LOW (ref 137–147)
Sodium: 126 mEq/L — ABNORMAL LOW (ref 137–147)

## 2014-10-04 LAB — CARBOXYHEMOGLOBIN
CARBOXYHEMOGLOBIN: 1.2 % (ref 0.5–1.5)
CARBOXYHEMOGLOBIN: 1.5 % (ref 0.5–1.5)
METHEMOGLOBIN: 0.9 % (ref 0.0–1.5)
Methemoglobin: 0.5 % (ref 0.0–1.5)
O2 Saturation: 43.7 %
O2 Saturation: 47.7 %
TOTAL HEMOGLOBIN: 9.9 g/dL — AB (ref 13.5–18.0)
Total hemoglobin: 10.2 g/dL — ABNORMAL LOW (ref 13.5–18.0)

## 2014-10-04 LAB — POCT I-STAT 3, ART BLOOD GAS (G3+)
ACID-BASE DEFICIT: 2 mmol/L (ref 0.0–2.0)
Bicarbonate: 20.8 mEq/L (ref 20.0–24.0)
O2 SAT: 93 %
Patient temperature: 97.8
TCO2: 22 mmol/L (ref 0–100)
pCO2 arterial: 29 mmHg — ABNORMAL LOW (ref 35.0–45.0)
pH, Arterial: 7.46 — ABNORMAL HIGH (ref 7.350–7.450)
pO2, Arterial: 62 mmHg — ABNORMAL LOW (ref 80.0–100.0)

## 2014-10-04 LAB — PROCALCITONIN: Procalcitonin: 24.46 ng/mL

## 2014-10-04 LAB — T3, FREE: T3, Free: 1.5 pg/mL — ABNORMAL LOW (ref 2.3–4.2)

## 2014-10-04 LAB — T4, FREE: Free T4: 1.13 ng/dL (ref 0.80–1.80)

## 2014-10-04 MED ORDER — BISACODYL 5 MG PO TBEC: 10.0000 mg | DELAYED_RELEASE_TABLET | Freq: Every day | ORAL | Status: DC | PRN

## 2014-10-04 MED ORDER — MAGNESIUM CITRATE PO SOLN
1.0000 | Freq: Once | ORAL | Status: DC
  Filled 2014-10-04: qty 296

## 2014-10-04 MED ORDER — SODIUM POLYSTYRENE SULFONATE 15 GM/60ML PO SUSP
30.0000 g | Freq: Once | ORAL | Status: AC
Start: 2014-10-04 — End: 2014-10-04
  Administered 2014-10-04: 30 g via ORAL
  Filled 2014-10-04: qty 120

## 2014-10-04 MED ORDER — DOBUTAMINE IN D5W 4-5 MG/ML-% IV SOLN
2.5000 ug/kg/min | INTRAVENOUS | Status: DC
  Administered 2014-10-04: 2.5 ug/kg/min via INTRAVENOUS
  Filled 2014-10-04: qty 250

## 2014-10-04 MED ORDER — ALBUMIN HUMAN 25 % IV SOLN
12.5000 g | Freq: Once | INTRAVENOUS | Status: AC
  Administered 2014-10-04: 12.5 g via INTRAVENOUS
  Filled 2014-10-04: qty 50

## 2014-10-04 MED ORDER — SORBITOL 70 % SOLN
30.0000 mL | Freq: Once | Status: AC
  Administered 2014-10-04: 30 mL via ORAL
  Filled 2014-10-04: qty 30

## 2014-10-04 NOTE — Progress Notes (Signed)
Patient GO:VPCHE Bischoff      DOB: 24-Jun-1956      KBT:[id number]  Came to meet family as agreed this am.  Only daughter Kenney Houseman present.   We will attempt to see later but do have other families scheduled. Updated Dr. Aundra Dubin.  May need interdisciplinary conference to impact on this patient's care.  Patient without capacity for decision making.     Natesha Hassey L. Lovena Le, MD MBA The Palliative Medicine Team at [facility] Phone: [phone] Pager: [phone] ( Use team phone after hours)

## 2014-10-04 NOTE — Progress Notes (Addendum)
Patient ID: Benjamin Rice, male   DOB: 1956-02-14, 59 y.o.   MRN: [id number]   SUBJECTIVE:  Yesterday he was started on  vasopressin 0.03 units/hr and norepi at 40 mcg. + 2.3 liters. Received fluid bolus x 3 yesterday for low BP.   Coughing this am while eating breakfast.   CO-OX 43% Creatinine 2.86>3.07    Scheduled Meds: . amiodarone  200 mg Oral BID  . antiseptic oral rinse  7 mL Mouth Rinse BID  . aspirin EC  81 mg Oral Daily  . atorvastatin  20 mg Oral q1800  . docusate sodium  100 mg Oral BID  . feeding supplement (RESOURCE BREEZE)  1 Container Oral TID BM  . fentaNYL  75 mcg Transdermal Q72H  . heparin subcutaneous  5,000 Units Subcutaneous 3 times per day  . hydrocortisone sodium succinate  50 mg Intravenous 4 times per day  . megestrol  40 mg Oral Daily  . piperacillin-tazobactam (ZOSYN)  IV  3.375 g Intravenous 3 times per day  . polyethylene glycol  17 g Oral Daily  . senna  1 tablet Oral Daily  . sodium chloride  10-40 mL Intracatheter Q12H  . sodium chloride  3 mL Intravenous Q12H  . [START ON 10/09/14] vancomycin  1,500 mg Intravenous Q48H   Continuous Infusions: . sodium chloride Stopped (10/21/2014 0200)  . DOPamine Stopped (10/16/2014 1600)  . norepinephrine (LEVOPHED) Adult infusion 40 mcg/min (10/04/14 0519)  . vasopressin (PITRESSIN) infusion - *FOR SHOCK* 0.03 Units/min (10/14/2014 2035)   PRN Meds:.sodium chloride, acetaminophen, alum & mag hydroxide-simeth, calcium carbonate, LORazepam, naLOXone (NARCAN)  injection, nitroGLYCERIN, ondansetron (ZOFRAN) IV, oxyCODONE, polyethylene glycol, senna-docusate, sodium chloride, sodium chloride, traZODone    Filed Vitals:   10/04/14 0300 10/04/14 0400 10/04/14 0500 10/04/14 0600  BP: 87/64 83/67 80/58  90/63  Pulse: 91 86    Temp: 97.7 F (36.5 C)     TempSrc: Oral     Resp: 17 27 29  33  Height:      Weight:      SpO2: 98% 99%      Intake/Output Summary (Last 24 hours) at 10/04/14 0823 Last data filed at  10/04/14 0600  Gross per 24 hour  Intake 2605.3 ml  Output    625 ml  Net 1980.3 ml    LABS: Basic Metabolic Panel:  Recent Labs  10/27/2014 0330 10/22/2014 1500 10/04/14 0450  NA 126* 125* 126*  K 5.7* 5.0 5.5*  CL 83* 86* 86*  CO2 21 20 18*  GLUCOSE 163* 225* 161*  BUN 83* 84* 88*  CREATININE 3.29* 2.86* 3.07*  CALCIUM 7.5* 7.3* 7.5*  MG 2.6*  --   --    Liver Function Tests:  Recent Labs  10/19/2014 0330  AST 394*  ALT 99*  ALKPHOS 426*  BILITOT 2.6*  PROT 7.0  ALBUMIN 2.0*   No results found for this basename: LIPASE, AMYLASE,  in the last 72 hours CBC:  Recent Labs  10/13/2014 0330 10/04/14 0450  WBC 8.3 11.3*  HGB 10.4* 10.2*  HCT 32.1* 31.5*  MCV 77.7* 79.5  PLT 448* 450*   Cardiac Enzymes: No results found for this basename: CKTOTAL, CKMB, CKMBINDEX, TROPONINI,  in the last 72 hours BNP: No components found with this basename: POCBNP,  D-Dimer: No results found for this basename: DDIMER,  in the last 72 hours Hemoglobin A1C: No results found for this basename: HGBA1C,  in the last 72 hours Fasting Lipid Panel: No results found for this basename:  CHOL, HDL, LDLCALC, TRIG, CHOLHDL, LDLDIRECT,  in the last 72 hours Thyroid Function Tests:  Recent Labs  10/08/2014 1100  TSH 0.315*   Anemia Panel: No results found for this basename: VITAMINB12, FOLATE, FERRITIN, TIBC, IRON, RETICCTPCT,  in the last 72 hours  RADIOLOGY: Dg Chest Port 1 View  09/08/2014   CLINICAL DATA:  Renal insufficiency. Ischemic cardiomyopathy. Short of breath.  EXAM: PORTABLE CHEST - 1 VIEW  COMPARISON:  None.  FINDINGS: Low volume chest. Low volumes accentuate the pulmonary vascular markings. There is no gross consolidation. The cardiopericardial silhouette is within normal limits.  Two lead LEFT subclavian AICD. LEFT ventricular apex and RIGHT atrial appendage leads.  Diffuse osseous sclerosis compatible with prostate cancer metastases noted from clinic visit 05/10/2014.   IMPRESSION: 1. Low volume chest without gross acute cardiopulmonary disease. Consider repeat PA and lateral with full inspiration when patient condition permits. 2. 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   Electronically Signed   By: Dereck Ligas M.D.   On: 09/20/2014 18:06    PHYSICAL EXAM General: NAD Neck: No JVD, no thyromegaly or thyroid nodule.  Lungs: Rhonchi through out lear to auscultation bilaterally with normal respiratory effort. CV: Nondisplaced PMI.  Heart regular S1/S2, no S3/S4, 1/6 SEM RUSB.  1+ edema 1/2 to knees bilaterally.  Abdomen: Soft, nontender, no hepatosplenomegaly, ++ distention.  Neurologic: Alert and oriented x 3.  Psych: Normal affect. Extremities: No clubbing or cyanosis. R and LLE 3+ edema.   TELEMETRY: Reviewed telemetry pt in NSR  ASSESSMENT AND PLAN: 58 yo with history of CAD s/p MIs in the past, ischemic cardiomyopathy with evidence for low output, prostate cancer with bony mets was admitted with VT storm (PMVT).  1. 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  Started on amiodarone.  LHC showed no obstructive CAD.  Suspect VT related to underlying cardiomyopathy.  - Continue amiodarone.  2. Acute on chronic systolic CHF: EF 09-32%.  Apparently ischemic cardiomyopathy with history of prior MIs though no obstructive disease on LHC this admission.   3. Shock: Progressive shock over the last day.  Component of cardiogenic shock but concerned for vasodilatory component.  Co-ox 43% this morning.  CVP low.  - Off Dopamine. On Vasopressin 0.03 units and norepi at 40 mcg.  Add dobutamine 2.5 mcg/kg/min. - On stress dose steroids. - Pancultured and on broad spectrum abx for possible infection. Blood cultures pending.  - Hold off on RHC.  4. CAD: Apparently has had multiple MIs but did not have significant disease on cath.  Will stop statin with increased LFTs.  5. AKI on CKD: Oliguric with rising creatinine.  Suspect related to  shock (with possible component of contrast nephropathy from cath).  No hydronephrosis on renal US. Creatinine trending up.   Not good HD candidate.  Will give Kayexalate again today.  6. Bicuspid aortic valve: Mild AS.  7. 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  Zytiga stopped due to concern that it could potentiate arrhythmias.   7.  Poor prognosis overall, palliative care to talk to family today. Family requesting full code.   8. Coughing --? Aspiration. Get swallow evaluation.   CLEGG,AMY NP-C   10/04/2014 8:23 AM  Patient seen with NP, agree with the above note.  Patient continues to do poorly, soft blood pressure despite 2 pressors, low co-ox this morning.  Concern for aspiration.  - Will add dobutamine at low dose to try to augment output.  He is on amiodarone so hopefully low dose dobutamine will not trigger VT.   Blood cultures negative so far, on broad spectrum abx. Will check procalcitonin.   ?Aspiration => swallow study.   AKI => no improvement, still poor UOP.  JVP and CVP not elevated but getting quite significant lower extremity edema.  Would hold off on further IV fluid.    Prognosis poor.  Patient probably does not have capacity for decision-making.  Family wants full code.  Continue discussions with palliative care service.   Loralie Champagne 10/04/2014 8:55 AM

## 2014-10-04 NOTE — Consult Note (Signed)
PULMONARY / CRITICAL CARE MEDICINE   Name: Benjamin Rice MRN: [id number] DOB: 03/27/1956    ADMISSION DATE:  09/03/2014 CONSULTATION DATE:  10/01/2014  REFERRING MD :  Loralie Champagne, MD  CHIEF COMPLAINT:  Shock refractory  INITIAL PRESENTATION: 58 y/o M cardiogenic shock, ARF, r/o septic  STUDIES:  10/6 renal US>>>neg hydro  SIGNIFICANT EVENTS: 9/28  Transferred from [place] to [facility] with polymorphic vt storm 10/6 hypotensive requiring dopamine without improvement, added levophed and transferred to CCU  10/7 decreased UOP noted, lasix ordered 10/7 empiric abx and stress steroids started due to worsening shock 10/7 cardiac rt heart cath cancelled due to shock 10/8- levo at 40, dobut started  High pressor needs remain  Filed Vitals:   10/04/14 0845  BP:   Pulse:   Temp: 97.8 F (36.6 C)  Resp:     I/O last 3 completed shifts: In: 3517.1 [P.O.:720; I.V.:2097.1; IV Piggyback:700] Out: 850 [Urine:850] Total I/O In: -  Out: 36 [Urine:83]   PHYSICAL EXAMINATION: General:  WDWN male lying in bed, NAD Neuro:  Alert and oriented HEENT:  Cry , low jvd Cardiovascular:  RRR, no m/r/g Lungs: coarse Abdomen:  Distented, diffuse tenderness, fluid wave Musculoskeletal:  Poor distal pulses Skin:  Cool  LABS:  All reviewed  Imaging US Renal Port  10/26/2014   CLINICAL DATA:  Subsequent encounter for renal insufficiency.  EXAM: RENAL/URINARY TRACT ULTRASOUND COMPLETE  COMPARISON:  None.  FINDINGS: Right Kidney:  Length: 1.7 cm. Cortical thinning without hydronephrosis or focal mass lesion.  Left Kidney:  Length: 10.3 cm. Cortical thinning without hydronephrosis or focal mass lesion.  Bladder:  Urinary bladder is decompressed by a Foley catheter.  Nodular echotexture within the liver is compatible patient's known metastatic disease. Small volume intraperitoneal free fluid noted.  IMPRESSION: No evidence for hydronephrosis.   Electronically Signed   By: Misty Stanley M.D.   On:  10/10/2014 07:02   pcxr 10/8>>>edema increased, atx   ASSESSMENT / PLAN:  PULMONARY OETT n/a A: Dyspnea Bibasilar atelectasis Edema increased P:   pcxr in am  May need intubation Consider abg assessment May need lasix today  CARDIOVASCULAR CVL n/a A:  Hx HTN Hx CHF (EF 20-25%) Hx CAD ICD Hypotension remains VT P:  On Levophed 40 Vaso maintain Dobutamine noted Consider albumin challenge 25% x 1 abg assessment Consider svo2 follow up Need a dnr  RENAL A:  Acute on chronic kidney disease Hyperkalemia Hyponatremia Shock UTI- chronic cath oliguric P:   Lasix consider if distress worsneing Monitor BMP May need bicarb drip if k rises Doubt he is a candidate for any forms of renal replacement therapy  GASTROINTESTINAL A:   Hx SBO Decreased appetite Shock liver P:   PPI lft follow up May need npo for SLP and increase distress  HEMATOLOGIC A:  Anemia of chronic disease P:  Monitor CBC scd  INFECTIOUS A:   Shock-urinary vs cardiogenic source, doubt prostate abscess but at risk P:   BCx2 10/7>>> UC Sputum Abx: zosyn, start date 10/7>>> Abx: vanc, start date 10/7>>>  Keep nosocomial coverage with foley Lactic noted, no repeat  ENDOCRINE A:  Hyperglycemia- steroids R/o AI Assess thyroid contribution P:   Monitor CBG SSI roids to remain as stress tsh noted, may need t3, t4, likley sick euthyroid  NEUROLOGIC A:  pain P:   RASS goal: Agree needs pall care discussion, Dr Lovena Le to assessing daily  Family updated: 10/7   Interdisciplinary Family Meeting v Palliative Care Meeting: Dr Lovena Le  TODAY'S SUMMARY: cardiogenic shock, r/o septic component, ad vaso, assess tsh, bolus, lactic, no hydro noted, bmet frequent  I have personally obtained a history, examined the patient, evaluated laboratory and imaging results, formulated the assessment and plan and placed orders. CRITICAL CARE: The patient is critically ill with multiple  organ systems failure and requires high complexity decision making for assessment and support, frequent evaluation and titration of therapies, application of advanced monitoring technologies and extensive interpretation of multiple databases. Critical Care Time devoted to patient care services described in this note is 30 minutes.   Lavon Paganini. Titus Mould, MD, FACP Pgr: [facility]  Pulmonary and [place] Pager: [phone]  10/10/2014, 9:13 AM

## 2014-10-04 NOTE — Progress Notes (Signed)
Inpatient Diabetes Program Recommendations  AACE/ADA: New Consensus Statement on Inpatient Glycemic Control (2013)  Target Ranges:  Prepandial:   less than 140 mg/dL      Peak postprandial:   less than 180 mg/dL (1-2 hours)      Critically ill patients:  140 - 180 mg/dL   Results for RAYMUNDO, ROUT (MRN [id number]) as of 10/04/2014 10:32  Ref. Range 10/19/2014 15:00 10/04/2014 04:50  Glucose Latest Range: 70-99 mg/dL 225 (H) 161 (H)   Diabetes history: No Outpatient Diabetes medications: NA Current orders for Inpatient glycemic control: None  Inpatient Diabetes Program Recommendations Correction (SSI): While ordered steroids, if appropriate for patient situation may want to consider ordering CBGs with Novolog sensitive correction if needed.  Thanks, Barnie Alderman, RN, MSN, CCRN Diabetes Coordinator Inpatient Diabetes Program [phone] (Team Pager) [phone] (AP office) [phone] [facility] office)

## 2014-10-04 NOTE — Progress Notes (Signed)
eLink Physician-Brief Progress Note Patient Name: Benjamin Rice DOB: Dec 08, 1956 MRN: [id number]   Date of Service  10/04/2014  HPI/Events of Note  Call from nurse reporting that patient is experiencing more resp distress with RR in the high 30s on [place] but maintaining sats.  ABG earlier during the day was 7.46/29/62 20.  On pressor and inotropic support.  Remains a full code.  eICU Interventions  Plan: Will try BiPAP for resp support tonight Continue with other therapies     Intervention Category Intermediate Interventions: Respiratory distress - evaluation and management  Vadhir Mcnay 10/04/2014, 11:56 PM

## 2014-10-04 NOTE — Procedures (Signed)
Arterial Catheter Insertion Procedure Note Benjamin Rice [id number] 30-Apr-1956  Procedure: Insertion of Arterial Catheter  Indications: Blood pressure monitoring  Procedure Details Consent: Risks of procedure as well as the alternatives and risks of each were explained to the (patient/caregiver).  Consent for procedure obtained. Time Out: Verified patient identification, verified procedure, site/side was marked, verified correct patient position, special equipment/implants available, medications/allergies/relevent history reviewed, required imaging and test results available.  Performed  Maximum sterile technique was used including antiseptics, cap, gloves, gown, hand hygiene, mask and sheet. Skin prep: Chlorhexidine; local anesthetic administered 20 gauge catheter was inserted into right radial artery using the Seldinger technique.  Evaluation Blood flow good; BP tracing good. Complications: No apparent complications.   Benjamin Rice 10/04/2014

## 2014-10-05 LAB — BASIC METABOLIC PANEL
Anion gap: 25 — ABNORMAL HIGH (ref 5–15)
BUN: 98 mg/dL — ABNORMAL HIGH (ref 6–23)
CO2: 20 mEq/L (ref 19–32)
CREATININE: 3.3 mg/dL — AB (ref 0.50–1.35)
Calcium: 6.9 mg/dL — ABNORMAL LOW (ref 8.4–10.5)
Chloride: 86 mEq/L — ABNORMAL LOW (ref 96–112)
GFR, EST AFRICAN AMERICAN: 22 mL/min — AB (ref >90)
GFR, EST NON AFRICAN AMERICAN: 19 mL/min — AB (ref >90)
GLUCOSE: 156 mg/dL — AB (ref 70–99)
Potassium: 5.9 mEq/L — ABNORMAL HIGH (ref 3.7–5.3)
Sodium: 131 mEq/L — ABNORMAL LOW (ref 137–147)

## 2014-10-05 LAB — PROCALCITONIN: Procalcitonin: 27.12 ng/mL

## 2014-10-05 LAB — POCT I-STAT 3, ART BLOOD GAS (G3+)
Acid-base deficit: 2 mmol/L (ref 0.0–2.0)
Acid-base deficit: 3 mmol/L — ABNORMAL HIGH (ref 0.0–2.0)
Bicarbonate: 21.5 mEq/L (ref 20.0–24.0)
Bicarbonate: 23.3 mEq/L (ref 20.0–24.0)
O2 SAT: 98 %
O2 SAT: 99 %
PCO2 ART: 34.6 mmHg — AB (ref 35.0–45.0)
PH ART: 7.373 (ref 7.350–7.450)
TCO2: 23 mmol/L (ref 0–100)
TCO2: 25 mmol/L (ref 0–100)
pCO2 arterial: 39.9 mmHg (ref 35.0–45.0)
pH, Arterial: 7.401 (ref 7.350–7.450)
pO2, Arterial: 113 mmHg — ABNORMAL HIGH (ref 80.0–100.0)
pO2, Arterial: 116 mmHg — ABNORMAL HIGH (ref 80.0–100.0)

## 2014-10-05 LAB — CBC
HEMATOCRIT: 28.9 % — AB (ref 39.0–52.0)
HEMOGLOBIN: 9.4 g/dL — AB (ref 13.0–17.0)
MCH: 25.3 pg — AB (ref 26.0–34.0)
MCHC: 32.5 g/dL (ref 30.0–36.0)
MCV: 77.7 fL — AB (ref 78.0–100.0)
PLATELETS: 358 10*3/uL (ref 150–400)
RBC: 3.72 MIL/uL — AB (ref 4.22–5.81)
RDW: 20.3 % — ABNORMAL HIGH (ref 11.5–15.5)
WBC: 11.7 10*3/uL — AB (ref 4.0–10.5)

## 2014-10-05 LAB — GLUCOSE, CAPILLARY: Glucose-Capillary: 142 mg/dL — ABNORMAL HIGH (ref 70–99)

## 2014-10-05 LAB — CARBOXYHEMOGLOBIN
Carboxyhemoglobin: 1.5 % (ref 0.5–1.5)
Methemoglobin: 0.8 % (ref 0.0–1.5)
O2 SAT: 70.5 %
Total hemoglobin: 9.8 g/dL — ABNORMAL LOW (ref 13.5–18.0)

## 2014-10-05 MED ORDER — SODIUM POLYSTYRENE SULFONATE 15 GM/60ML PO SUSP: 30.0000 g | Freq: Once | ORAL | Status: DC

## 2014-10-05 MED ORDER — FUROSEMIDE 10 MG/ML IJ SOLN: 80.0000 mg | Freq: Once | INTRAMUSCULAR | Status: DC

## 2014-10-05 MED ORDER — MORPHINE BOLUS VIA INFUSION
5.0000 mg | INTRAVENOUS | Status: DC | PRN
  Filled 2014-10-05: qty 20

## 2014-10-05 MED ORDER — MORPHINE SULFATE 10 MG/ML IJ SOLN
10.0000 mg/h | INTRAMUSCULAR | Status: DC
  Administered 2014-10-05: 10 mg/h via INTRAVENOUS
  Filled 2014-10-05: qty 10

## 2014-10-05 MED ORDER — LORAZEPAM 2 MG/ML IJ SOLN
1.0000 mg | Freq: Once | INTRAMUSCULAR | Status: AC
  Administered 2014-10-05: 1 mg via INTRAVENOUS
  Filled 2014-10-05: qty 1

## 2014-10-09 LAB — CULTURE, BLOOD (ROUTINE X 2)
CULTURE: NO GROWTH
Culture: NO GROWTH

## 2014-10-11 ENCOUNTER — Telehealth: Payer: Self-pay | Admitting: Internal Medicine

## 2014-10-11 NOTE — Telephone Encounter (Addendum)
Received death certificate from Chyrel Masson who received it in the mail on 10/11/14, for me to take to Dr. Debara Pickett to fill out.  I then gave death certificate to Dr. Debara Pickett to fill out on 10/11/14. cbr  Received completed death certificate back from Dr. Lysbeth Penner nurse Eliezer Lofts, I then made copy of death certificate and place in mail tray to be delivered and placed copied certificate in batch scan folder to go to scan center. cbr

## 2014-10-11 NOTE — Telephone Encounter (Deleted)
Receive completed death certificate back from [place], Dr. Lysbeth Penner nurse

## 2014-10-23 NOTE — Progress Notes (Signed)
Note/chart reviewed. Agree with note.   Layne Dilauro RD, LDN, CNSC [phone] Pager [phone] After Hours Pager   

## 2014-10-28 DIAGNOSIS — Z515 Encounter for palliative care: Secondary | ICD-10-CM | POA: Insufficient documentation

## 2014-10-28 DIAGNOSIS — R570 Cardiogenic shock: Secondary | ICD-10-CM | POA: Insufficient documentation

## 2014-10-28 DIAGNOSIS — R0600 Dyspnea, unspecified: Secondary | ICD-10-CM | POA: Insufficient documentation

## 2014-10-28 NOTE — Progress Notes (Signed)
I have had extensive discussions with family sona nd wife.  We discussed patients current circumstances and organ failures. We also discussed patient's prior wishes under circumstances such as this. Family has decided to NOT perform resuscitation if arresst. Family has decided to offer full comfort care. They are aware that the patient may be transferred to palliative care floor for continued comfort care needs. They have been fully updated on the process and expectations.  Benjamin Rice. Titus Mould, MD, [place] Pgr: [phone] [facility]  Notified Dr Aundra Dubin

## 2014-10-28 NOTE — Consult Note (Signed)
PULMONARY / CRITICAL CARE MEDICINE   Name: Benjamin Rice MRN: [id number] DOB: 1956-05-21    ADMISSION DATE:  09/20/2014 CONSULTATION DATE:  10/02/2014  REFERRING MD :  Loralie Champagne, MD  CHIEF COMPLAINT:  Shock refractory  INITIAL PRESENTATION: 58 y/o M cardiogenic shock, ARF, r/o septic  STUDIES:  10/6 renal US>>>neg hydro  SIGNIFICANT EVENTS: 9/28  Transferred from [place] to [facility] with polymorphic vt storm 10/6 hypotensive requiring dopamine without improvement, added levophed and transferred to CCU  10/7 decreased UOP noted, lasix ordered 10/7 empiric abx and stress steroids started due to worsening shock 10/7 cardiac rt heart cath cancelled due to shock 10/8- levo at 40, dobut started 10/9 worsening resp failure  Filed Vitals:   Oct 26, 2014 0846  BP: 97/58  Pulse: 79  Temp:   Resp: 25    I/O last 3 completed shifts: In: 2778.1 [I.V.:2028.1; IV Piggyback:750] Out: 443 [Urine:443]     PHYSICAL EXAMINATION: General:  WDWN male lying in bed, NAD Neuro:  Alert and oriented HEENT:  jvd increased Cardiovascular:  RRR, no m/r/g Lungs: coarse increased worsening ronchi Abdomen:  Distented, diffuse tenderness, fluid wave increased Musculoskeletal:  Poor distal pulses Skin:  Cool  LABS:  All reviewed  Imaging US Renal Port  10/14/2014   CLINICAL DATA:  Subsequent encounter for renal insufficiency.  EXAM: RENAL/URINARY TRACT ULTRASOUND COMPLETE  COMPARISON:  None.  FINDINGS: Right Kidney:  Length: 1.7 cm. Cortical thinning without hydronephrosis or focal mass lesion.  Left Kidney:  Length: 10.3 cm. Cortical thinning without hydronephrosis or focal mass lesion.  Bladder:  Urinary bladder is decompressed by a Foley catheter.  Nodular echotexture within the liver is compatible patient's known metastatic disease. Small volume intraperitoneal free fluid noted.  IMPRESSION: No evidence for hydronephrosis.   Electronically Signed   By: Misty Stanley M.D.   On: 10/20/2014 07:02   pcxr  10/8>>>edema increased, atx   ASSESSMENT / PLAN:  PULMONARY OETT n/a A: Dyspnea Bibasilar atelectasis Edema increased Acute resp failure P:   If continued support, place ETT to 8 cc/kg, rate 14 100% peep 5 Obtain cpxr if continued support abg reviewed Lasix increase  CARDIOVASCULAR CVL n/a A:  Hx HTN Hx CHF (EF 20-25%) Hx CAD ICD Hypotension remains VT P:  On Levophed 40 Vaso maintain dob per cards  RENAL A:  Acute on chronic kidney disease Hyperkalemia Hyponatremia Shock UTI- chronic cath oliguric P:   Lasix maximzie  GASTROINTESTINAL A:   Hx SBO Decreased appetite Shock liver P:   PPI Npo, distress  INFECTIOUS A:   Shock-urinary vs cardiogenic source, doubt prostate abscess but at risk P:   BCx2 10/7>>> UC Sputum Abx: zosyn, start date 10/7>>> Abx: vanc, start date 10/7>>>  Consider dc all abx  ENDO Family updated: 10/7  Interdisciplinary Family Meeting v Palliative Care Meeting: I performed 10/9   TODAY'S SUMMARY: UPDATE: he is dying, see update on comfort care wishes  I have personally obtained a history, examined the patient, evaluated laboratory and imaging results, formulated the assessment and plan and placed orders. CRITICAL CARE: The patient is critically ill with multiple organ systems failure and requires high complexity decision making for assessment and support, frequent evaluation and titration of therapies, application of advanced monitoring technologies and extensive interpretation of multiple databases. Critical Care Time devoted to patient care services described in this note is 30 minutes.   Lavon Paganini. Titus Mould, MD, [place] Pgr: [facility]  Pulmonary and [employer]  Pager: [phone]  09/29/2014, 9:13 AM

## 2014-10-28 NOTE — Progress Notes (Signed)
09/27/2014 2200  Clinical Encounter Type  Visited With Family;Health care provider  Visit Type Follow-up;Death  Referral From Nurse  Spiritual Encounters  Spiritual Needs Grief support  Stress Factors  Family Stress Factors Loss   Chaplain was paged to the patient's room at 7:20PM. Chaplain was notified that the patient passed away. Chaplain entered a crowded room of friends and family and provided grief support. Patient's daughters were having the most difficult time with the death of their father. Patient's daughters grieved heavily and did not want to leave the bedside. Chaplain and nurses decided to move the family into the waiting room and when appropriate only allowed immediate family back into the patient's room to grieve. Many complex family dynamics were present but family was given a significant amount of time to grieve at bedside. Page on-call chaplain if further assistance is needed. Gar Ponto, Chaplain 10:07 PM

## 2014-10-28 NOTE — Discharge Summary (Signed)
    Discharge Summary / Death Note   Benjamin Rice MRN: [id number] DOB: 11/21/1956  Admit Date: 10-09-2014 Time of Death:   Date of Death:  Attending Physician: Evans Lance, MD PCP: No primary provider on file.  Cause of Death: End-stage heart failure, complicated by metastatic prostate cancer  Hospital Diagnoses:  Active Problems:   Ventricular tachycardia, polymorphic   Prostate cancer metastatic to bone   Acute on chronic combined systolic and diastolic congestive heart failure   Cardiomyopathy, ischemic  Procedures: Procedure Performed:   Selective Coronary Angiography  Operator: Lauree Chandler, MD   Arterial access site: Right radial artery.   Indication: 58 yo male with history of cardiomyopathy, VT storm. Cardiac cath to exclude CAD.   Procedure Details:  The risks, benefits, complications, treatment options, and expected outcomes were discussed with the patient. The patient and/or family concurred with the proposed plan, giving informed consent. The patient was brought to the cath lab after IV hydration was begun and oral premedication was given. The patient was further sedated with Versed. The right wrist was assessed with a reverse Allens test which was positive. The right wrist was prepped and draped in a sterile fashion. 1% lidocaine was used for local anesthesia. Using the modified Seldinger access technique, a 5 French sheath was placed in the right radial artery. 3 mg Verapamil was given through the sheath. 3500 units IV heparin was given. Standard diagnostic catheters were used to perform selective coronary angiography. I attempted to cross the aortic valve but the patient had radial artery spasm and catheters could not be advanced. I elected not to cross the aortic valve. The sheath was removed from the right radial artery and a VascBand hemostasis band was applied at the arteriotomy site on the right wrist.  There were no immediate complications. The patient  was taken to the recovery area in stable condition.   Hemodynamic Findings:  Central aortic pressure: 82/54   Angiographic Findings:  Left main: No obstructive disease.  Left Anterior Descending Artery: Large caliber vessel that courses to the apex. No obstructive disease. There are several very small caliber diagonal branches.  Circumflex Artery: Large caliber vessel with large bifurcating obtuse marginal branch, moderate caliber second and third obtuse marginal branches. No obstructive disease.  Right Coronary Artery: Small non-dominant vessel with no obstructive disease.  Left Ventricular Angiogram: Deferred.   Impression:  1. No angiographic evidence of CAD   Recommendations: Further management of VT storm per EP team.  Complications: None. The patient tolerated the procedure well.   Hostpital Course:  2023-10-10 Transferred from [place] to [facility] with polymorphic vt storm  9/30  Left heart catheterization shows no coronary artery disease 10/6 hypotensive requiring dopamine without improvement, added levophed and transferred to CCU  10/7 decreased UOP noted, lasix ordered  10/7 empiric abx and stress steroids started due to worsening shock  10/7 cardiac rt heart cath cancelled due to shock  10/8- levo at 10, dobut started  2023/10/21 worsening resp failure - family meeting to discuss palliative care, decision to transition to comfort care  Time of death: 19:21 on 10-20-2014, pronounced by 2 critical care nurses  Pixie Casino, MD, [facility] Attending Cardiologist [facility]  10/20/2014 7:51 PM

## 2014-10-28 NOTE — Progress Notes (Signed)
Chaplain arrived via page that family transitioned to comfort care.   Very large number of family bedside with pt.   Chaplain engaged in empathic listening and emotional support.   Pt is still breathing and responding to touch.   Theodor Mustin, [place] 2014-10-27 1:34 PM

## 2014-10-28 NOTE — Progress Notes (Signed)
ANTIBIOTIC CONSULT NOTE - Follow-up  Pharmacy Consult for Vancomycin and Zosyn  Indication: rule out sepsis  No Known Allergies  Patient Measurements: Height: 5\' 10"  (177.8 cm) Weight: 217 lb 6 oz (98.6 kg) IBW/kg (Calculated) : 73  Vital Signs: Temp: 97.6 F (36.4 C) (10/09 0800) Temp Source: Axillary (10/09 0800) BP: 97/58 mmHg (10/09 0846) Pulse Rate: 79 (10/09 0846) Intake/Output from previous day: 10/08 0701 - 10/09 0700 In: 1981.3 [I.V.:1381.3; IV Piggyback:600] Out: 218 [Urine:218] Intake/Output from this shift:    Labs:  Recent Labs  10/13/2014 0330  10/04/14 0450 10/04/14 1400 Oct 30, 2014 0455 10-30-2014 0825  WBC 8.3  --  11.3*  --   --  11.7*  HGB 10.4*  --  10.2*  --   --  9.4*  PLT 448*  --  450*  --   --  358  CREATININE 3.29*  < > 3.07* 2.91* 3.30*  --   < > = values in this interval not displayed. Estimated Creatinine Clearance: 28.7 ml/min (by C-G formula based on Cr of 3.3). No results found for this basename: VANCOTROUGH, Corlis Leak, VANCORANDOM, [place], [place], [employer], [place], TOBRAPEAK, TOBRARND, AMIKACINPEAK, AMIKACINTROU, AMIKACIN,  in the last 72 hours   Microbiology: Recent Results (from the past 720 hour(s))  MRSA PCR SCREENING     Status: None   Collection Time    09/25/2014  4:41 PM      Result Value Ref Range Status   MRSA by PCR NEGATIVE  NEGATIVE Final   Comment:            The GeneXpert MRSA Assay (FDA     approved for NASAL specimens     only), is one component of a     comprehensive MRSA colonization     surveillance program. It is not     intended to diagnose MRSA     infection nor to guide or     monitor treatment for     MRSA infections.  CULTURE, BLOOD (ROUTINE X 2)     Status: None   Collection Time    10/25/2014  8:15 AM      Result Value Ref Range Status   Specimen Description BLOOD RIGHT ANTECUBITAL   Final   Special Requests BOTTLES DRAWN AEROBIC AND ANAEROBIC 5CC   Final   Culture  Setup Time     Final   Value: 09/27/2014 14:27     Performed at [facility]   Culture     Final   Value:        BLOOD CULTURE RECEIVED NO GROWTH TO DATE CULTURE WILL BE HELD FOR 5 DAYS BEFORE ISSUING A FINAL NEGATIVE REPORT     Performed at [facility]   Report Status PENDING   Incomplete  CULTURE, BLOOD (ROUTINE X 2)     Status: None   Collection Time    10/11/2014  8:45 AM      Result Value Ref Range Status   Specimen Description BLOOD CENTRAL LINE   Final   Special Requests BOTTLES DRAWN AEROBIC AND ANAEROBIC 10CC   Final   Culture  Setup Time     Final   Value: 10/02/2014 14:27     Performed at [facility]   Culture     Final   Value:        BLOOD CULTURE RECEIVED NO GROWTH TO DATE CULTURE WILL BE HELD FOR 5 DAYS BEFORE ISSUING A FINAL NEGATIVE REPORT     Performed at  [facility]   Report Status PENDING   Incomplete    Medical History: Past Medical History  Diagnosis Date  . Cancer     prostate CA with bone mets  . Hypertension   . CAD (coronary artery disease)     s/p 5 MI, last cath 2010 per pt in [place] or [place]  . Ischemic cardiomyopathy     s/p St Jude ICD 2008  . Chronic CHF   . Renal insufficiency     Medications:  Scheduled:  . amiodarone  200 mg Oral BID  . antiseptic oral rinse  7 mL Mouth Rinse BID  . aspirin EC  81 mg Oral Daily  . docusate sodium  100 mg Oral BID  . feeding supplement (RESOURCE BREEZE)  1 Container Oral TID BM  . fentaNYL  75 mcg Transdermal Q72H  . furosemide  80 mg Intravenous Once  . heparin subcutaneous  5,000 Units Subcutaneous 3 times per day  . hydrocortisone sodium succinate  50 mg Intravenous 4 times per day  . magnesium citrate  1 Bottle Oral Once  . megestrol  40 mg Oral Daily  . piperacillin-tazobactam (ZOSYN)  IV  3.375 g Intravenous 3 times per day  . polyethylene glycol  17 g Oral Daily  . senna  1 tablet Oral Daily  . sodium chloride  10-40 mL Intracatheter Q12H  . sodium chloride  3 mL  Intravenous Q12H  . sodium polystyrene  30 g Rectal Once  . vancomycin  1,500 mg Intravenous Q48H  Medications: Vanc 10/7>> Zosyn 10/7>>  BCx2 10/7>>NGTD   Assessment: 58 yo male with cardiogenic shock and persistent hypotension, possible sepsis, continuing empiric antibiotics. Blood cultures drawn 10/7, NGTD. WBC 11.7, pt afebrile.    Goal of Therapy:  Vancomycin trough level 15-20 mcg/ml Resolution of symptoms  Plan:  - Continue vancomycin 1500 mg IV q48h - Continue Zosyn 3.375 g IV q8h (4 hr infusion) - Vanc trough at steady state (4 more days) - Monitor culture results, de-escalation of therapy if appropriate  Allyce Bochicchio E. Ioannis Schuh, Pharm.D Clinical Pharmacy Resident Pager: [phone] 2014-10-12 9:25 AM

## 2014-10-28 NOTE — Progress Notes (Addendum)
Patient without chest rise, no audible breath sounds, no auscultated apical pulse, monitor shows asystole, skin cool and modeled, two RNs pronounced Joseph Berkshire  RN Materials engineer. Dr. Debara Pickett notified, Dr. Nelda Marseille notified, chaplain notified and at bedside. Emotional support provided to family. Laie Donor referral made. Morphine waste of 54mL.

## 2014-10-28 NOTE — Progress Notes (Signed)
Nutrition Brief Note  Chart reviewed. Pt now transitioning to comfort care.  No further nutrition interventions warranted at this time.  Please re-consult as needed.   Fantasy Donald RD, LDN, CNSC [phone] Pager [phone] After Hours Pager    

## 2014-10-28 NOTE — Progress Notes (Signed)
Pt experiencing more respiratory distress this evening. RR in the high 30s low 40s very diaphoretic. Maintaining O2 sats 95% on 6L [place]. Spoke with MD in [place] she recommended starting BiPap. Respiratory Therapy called to bedside with BiPap machine. Pt started on bipap. PA from CCM came by to check on pt after bipap was initiated and felt he was doing well on bipap for now. Will continue to assess and monitor the patient closely.

## 2014-10-28 NOTE — Progress Notes (Signed)
Patient ID: Benjamin Rice, male   DOB: 1956-02-08, 58 y.o.   MRN: [id number]   SUBJECTIVE: Mr Bosher developed respiratory distress overnight and is now on Bipap.  He has considerable bone pain from his metastatic prostate cancer.  PCT 27 suggestive of infection. Abdomen distended, no BM x several days.   CVP 9 Creatinine 2.86>3.07 > 3.3   Scheduled Meds: . amiodarone  200 mg Oral BID  . antiseptic oral rinse  7 mL Mouth Rinse BID  . aspirin EC  81 mg Oral Daily  . docusate sodium  100 mg Oral BID  . feeding supplement (RESOURCE BREEZE)  1 Container Oral TID BM  . fentaNYL  75 mcg Transdermal Q72H  . furosemide  80 mg Intravenous Once  . heparin subcutaneous  5,000 Units Subcutaneous 3 times per day  . hydrocortisone sodium succinate  50 mg Intravenous 4 times per day  . magnesium citrate  1 Bottle Oral Once  . megestrol  40 mg Oral Daily  . piperacillin-tazobactam (ZOSYN)  IV  3.375 g Intravenous 3 times per day  . polyethylene glycol  17 g Oral Daily  . senna  1 tablet Oral Daily  . sodium chloride  10-40 mL Intracatheter Q12H  . sodium chloride  3 mL Intravenous Q12H  . sodium polystyrene  30 g Rectal Once  . vancomycin  1,500 mg Intravenous Q48H   Continuous Infusions: . sodium chloride Stopped (10/22/2014 0200)  . DOBUTamine 2.5 mcg/kg/min (10/04/14 0954)  . norepinephrine (LEVOPHED) Adult infusion 40 mcg/min (October 30, 2014 0435)  . vasopressin (PITRESSIN) infusion - *FOR SHOCK* 0.03 Units/min (10/04/14 1215)   PRN Meds:.sodium chloride, acetaminophen, alum & mag hydroxide-simeth, bisacodyl, calcium carbonate, LORazepam, naLOXone (NARCAN)  injection, nitroGLYCERIN, ondansetron (ZOFRAN) IV, oxyCODONE, polyethylene glycol, senna-docusate, sodium chloride, sodium chloride, traZODone    Filed Vitals:   2014-10-30 0422 Oct 30, 2014 0500 10-30-2014 0600 Oct 30, 2014 0800  BP:      Pulse: 79 78 78   Temp:    97.6 F (36.4 C)  TempSrc:    Axillary  Resp: 27 26 26    Height:      Weight:        SpO2: 100% 100% 100%     Intake/Output Summary (Last 24 hours) at 10-30-2014 0819 Last data filed at 10-30-14 0600  Gross per 24 hour  Intake 1922.5 ml  Output    170 ml  Net 1752.5 ml    LABS: Basic Metabolic Panel:  Recent Labs  09/29/2014 0330  10/04/14 1400 October 30, 2014 0455  NA 126*  < > 126* 131*  K 5.7*  < > 5.1 5.9*  CL 83*  < > 85* 86*  CO2 21  < > 21 20  GLUCOSE 163*  < > 229* 156*  BUN 83*  < > 90* 98*  CREATININE 3.29*  < > 2.91* 3.30*  CALCIUM 7.5*  < > 7.1* 6.9*  MG 2.6*  --   --   --   < > = values in this interval not displayed. Liver Function Tests:  Recent Labs  10/22/2014 0330  AST 394*  ALT 99*  ALKPHOS 426*  BILITOT 2.6*  PROT 7.0  ALBUMIN 2.0*   No results found for this basename: LIPASE, AMYLASE,  in the last 72 hours CBC:  Recent Labs  09/28/2014 0330 10/04/14 0450  WBC 8.3 11.3*  HGB 10.4* 10.2*  HCT 32.1* 31.5*  MCV 77.7* 79.5  PLT 448* 450*   Cardiac Enzymes: No results found for this basename: CKTOTAL, CKMB,  CKMBINDEX, TROPONINI,  in the last 72 hours BNP: No components found with this basename: POCBNP,  D-Dimer: No results found for this basename: DDIMER,  in the last 72 hours Hemoglobin A1C: No results found for this basename: HGBA1C,  in the last 72 hours Fasting Lipid Panel: No results found for this basename: CHOL, HDL, LDLCALC, TRIG, CHOLHDL, LDLDIRECT,  in the last 72 hours Thyroid Function Tests:  Recent Labs  10/19/2014 1100 10/04/14 1030  TSH 0.315*  --   T3FREE  --  1.5*   Anemia Panel: No results found for this basename: VITAMINB12, FOLATE, FERRITIN, TIBC, IRON, RETICCTPCT,  in the last 72 hours  RADIOLOGY: Dg Chest Port 1 View  08/28/2014   CLINICAL DATA:  Renal insufficiency. Ischemic cardiomyopathy. Short of breath.  EXAM: PORTABLE CHEST - 1 VIEW  COMPARISON:  None.  FINDINGS: Low volume chest. Low volumes accentuate the pulmonary vascular markings. There is no gross consolidation. The cardiopericardial  silhouette is within normal limits.  Two lead LEFT subclavian AICD. LEFT ventricular apex and RIGHT atrial appendage leads.  Diffuse osseous sclerosis compatible with prostate cancer metastases noted from clinic visit 05/10/2014.  IMPRESSION: 1. Low volume chest without gross acute cardiopulmonary disease. Consider repeat PA and lateral with full inspiration when patient condition permits. 2. 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   Electronically Signed   By: Dereck Ligas M.D.   On: 09/23/2014 18:06    PHYSICAL EXAM General: NAD but sedated.  Neck: No JVD, no thyromegaly or thyroid nodule.  Lungs: Rhonchi through out lear to auscultation bilaterally with normal respiratory effort. CV: Nondisplaced PMI.  Heart regular S1/S2, no S3/S4, 1/6 SEM RUSB.    Abdomen: Soft, nontender, no hepatosplenomegaly, ++ distention.  Neurologic: Sedated. Extremities: No clubbing or cyanosis. R and LLE 3+ edema.   TELEMETRY: Reviewed telemetry pt in NSR  ASSESSMENT AND PLAN: 58 yo with history of CAD s/p MIs in the past, ischemic cardiomyopathy with evidence for low output, prostate cancer with bony mets was admitted with VT storm (PMVT).  1. 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  Started on amiodarone.  LHC showed no obstructive CAD.  Suspect VT related to underlying cardiomyopathy.  - Continue amiodarone.  2. Acute on chronic systolic CHF: EF 40-98%.  Apparently ischemic cardiomyopathy with history of prior MIs though no obstructive disease on LHC this admission.   3. Shock:  Susepct cardiogenic + septic shock.  Dobutamine started yesterday.   - Check co-ox this morning.  - With respiratory distress/CVP 9, will given Lasix 80 mg IV x 1 this morning and follow response.  - On dobutamine 2.5, Vasopressin 0.03 units and norepi at 40 mcg.   - On stress dose steroids. - Pancultured and on broad spectrum abx for possible infection. Blood cultures so far negative.   4. CAD:  Apparently has had multiple MIs but did not have significant disease on cath.  Will stop statin with increased LFTs.  5. AKI on CKD: Oliguric with rising creatinine.  Suspect related to shock (with possible component of contrast nephropathy from cath).  No hydronephrosis on renal US. Creatinine trending up.   Not good HD candidate.   6. Bicuspid aortic valve: Mild AS.  7. Prostate cancer: Metastatic to bones, followed in [place].  Zytiga stopped due to concern that it could potentiate arrhythmias.   8. Pulmonary: Hypoxemic respiratory failure.  Possible aspiration PNA, now on Bipap.  As above, will give Lasix this morning.  Given degree of sedation and need for pain meds, if we are going to aggressively treat him, he will need intubation.  I talked to family about this this morning and presented 2 options: aggressive treatment with intubation versus backing off to comfort care.  I think that comfort care is ultimately the best course here.  Family will discuss and get back to me. Would like palliative care service input as well today.  9. Abdominal distention: will get KUB.  Loralie Champagne 26-Oct-2014 8:19 AM

## 2014-10-28 NOTE — Progress Notes (Signed)
PT Cancellation Note  Patient Details Name: Benjamin Rice MRN: [id number] DOB: 1956-01-17   Cancelled Treatment:    Reason Eval/Treat Not Completed: Medical issues which prohibited therapy. Noted that pt developed respiratory distress overnight and is now on Bipap. Per MD note, comfort care may be the best course at this time, however palliative has not been in to see pt/family yet today. Will hold PT today and await goals of care.   Rolinda Roan 11-04-2014, 9:16 AM  Rolinda Roan, PT, DPT Acute Rehabilitation Services Pager: [phone]

## 2014-10-28 DEATH — deceased

## 2014-12-06 ENCOUNTER — Encounter (HOSPITAL_COMMUNITY): Payer: Self-pay | Admitting: Cardiovascular Disease

## 2016-08-09 IMAGING — CR DG CHEST 1V PORT
1 series · 1 of 1 positions shown · non-contrast
Comparison: Portable exam 6664 hr compared to 09/24/2014

CLINICAL DATA: Chest pain for 3 days, shortness of breath and cold
like symptoms cough and congestion for 6 days, question pneumonia ;
personal history of prostate cancer, hypertension, coronary artery
disease, chronic CHF, ischemic cardiomyopathy

EXAM:
PORTABLE CHEST - 1 VIEW

[AP]
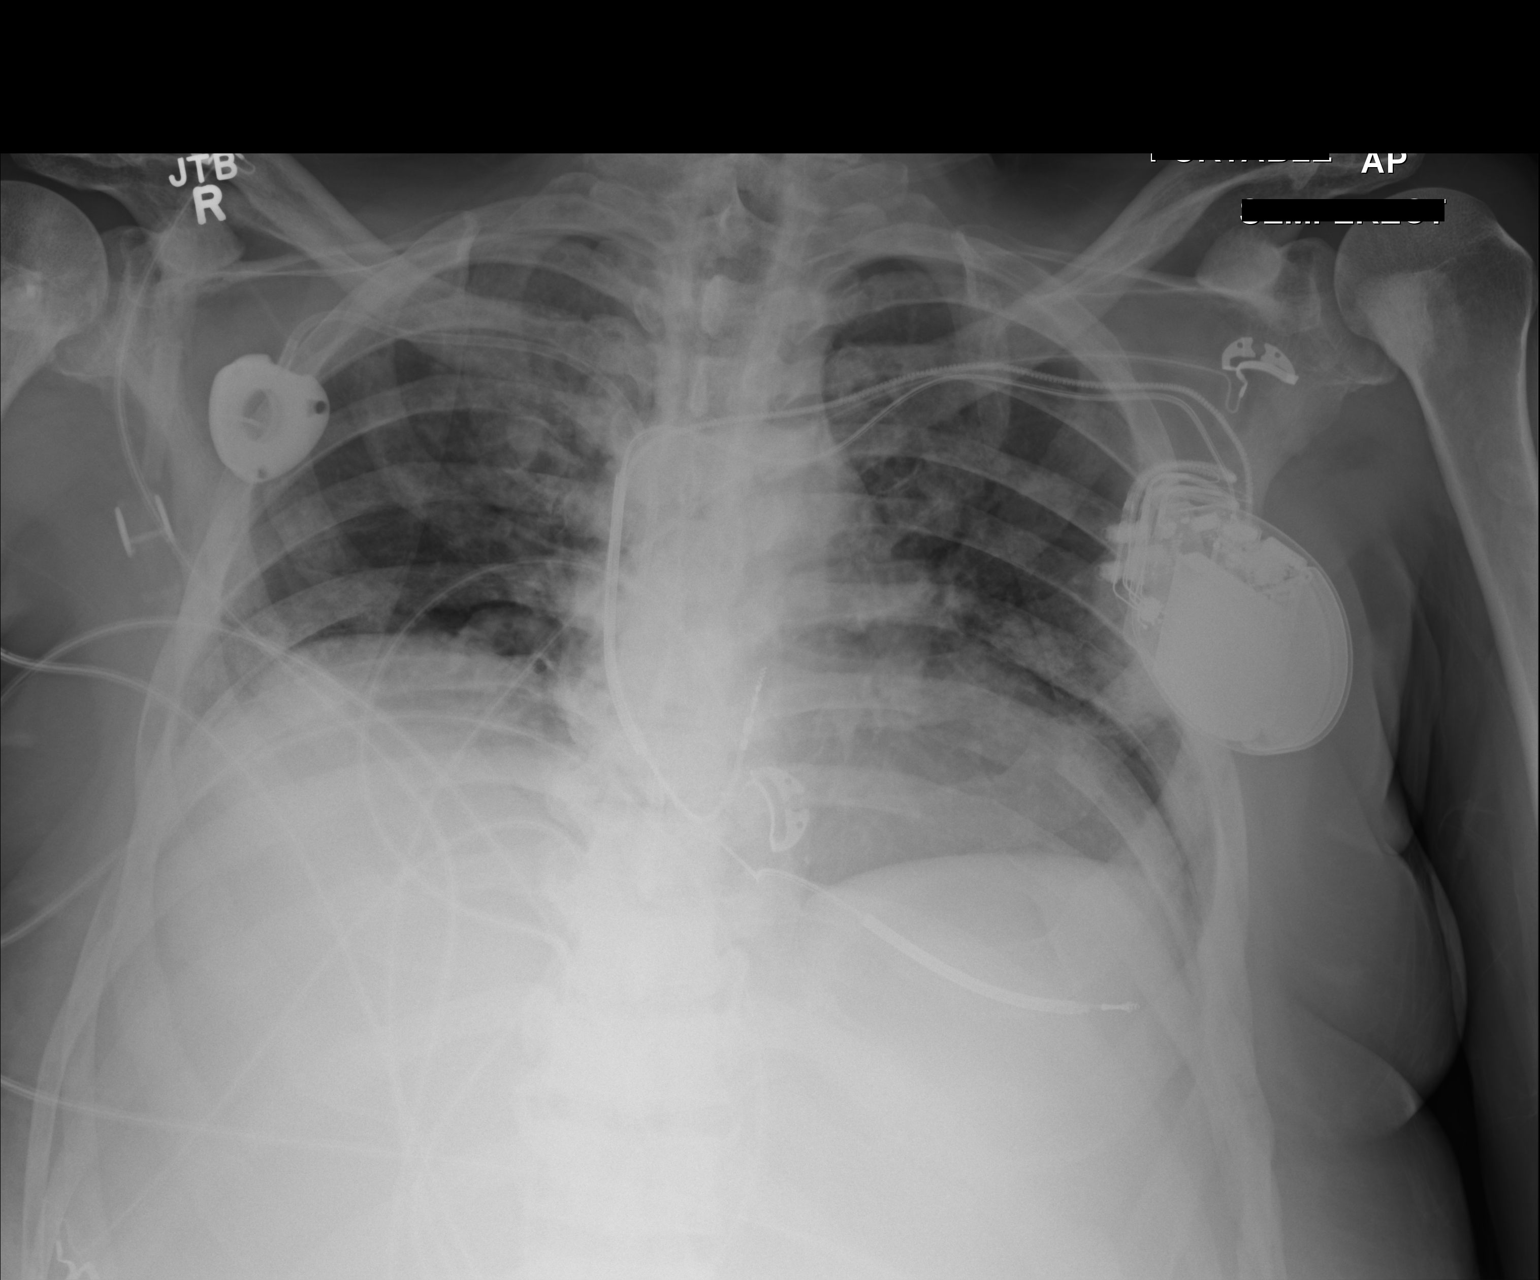

[1 of 1 positions shown; findings below may reference images not displayed]

FINDINGS: LEFT subclavian transvenous pacemaker/AICD leads project over RIGHT
atrium and RIGHT ventricle, unchanged.

Minimal enlargement of cardiac silhouette.

Mediastinal contours and pulmonary vascularity normal.

Decreased lung volumes with RIGHT basilar atelectasis.

No definite infiltrate, pleural effusion or pneumothorax.

Scattered areas of osseous sclerosis consistent with osseous
metastatic disease.
IMPRESSION: Osseous metastatic disease secondary to prostate cancer.

Low lung volumes with RIGHT basilar atelectasis.

Minimal enlargement of cardiac silhouette post AICD.

## 2016-08-09 IMAGING — US US RENAL PORT
1 series · 14 of 24 positions shown · non-contrast
Comparison: None.

CLINICAL DATA: Subsequent encounter for renal insufficiency.

EXAM:
RENAL/URINARY TRACT ULTRASOUND COMPLETE

[Series 1: us renal port · 0.33mm/px · 14 of 24 slices shown]
[im 1/24]
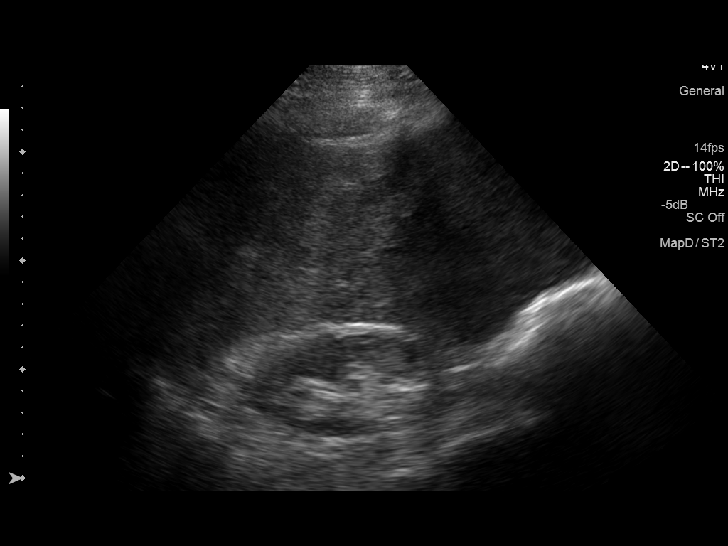
[im 3/24]
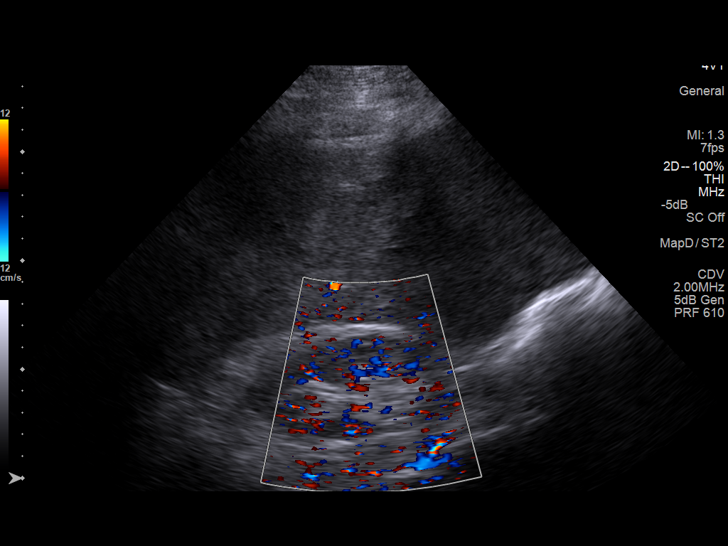
[im 5/24]
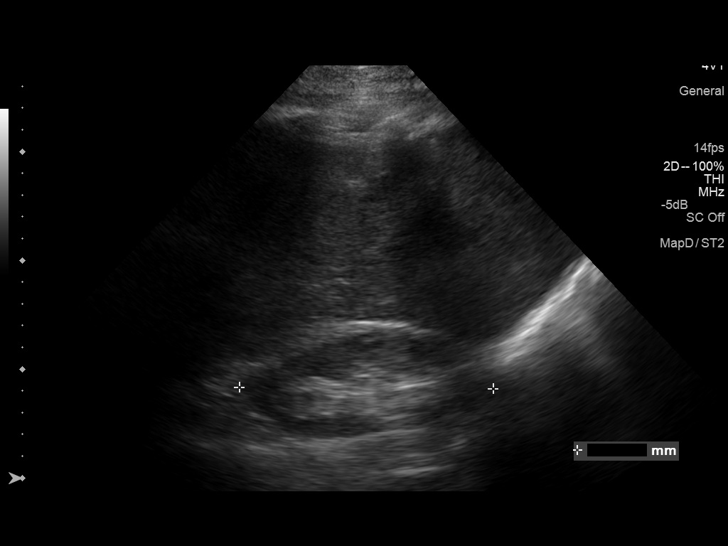
[im 7/24]
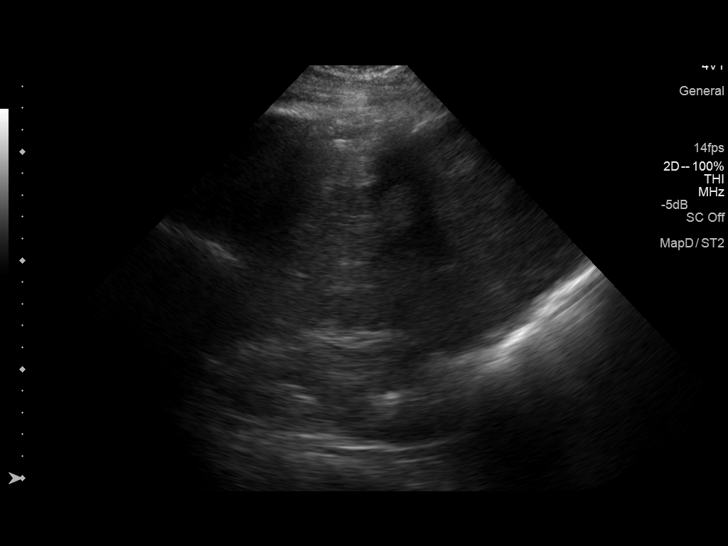
[im 8/24]
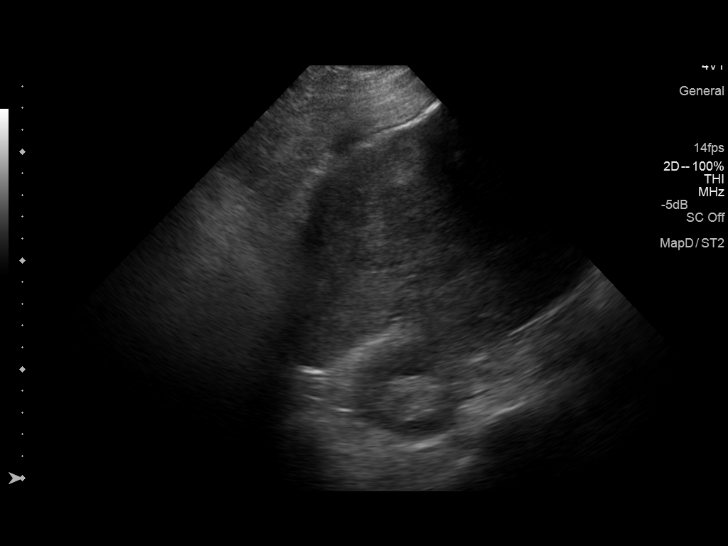
[im 10/24]
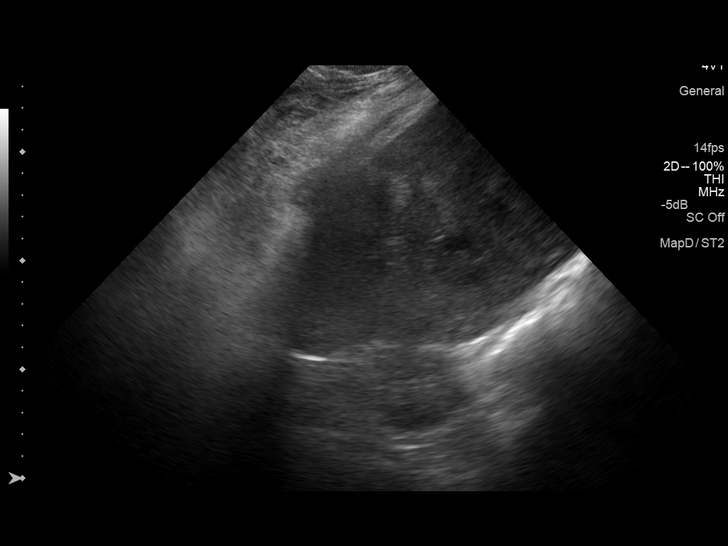
[im 12/24]
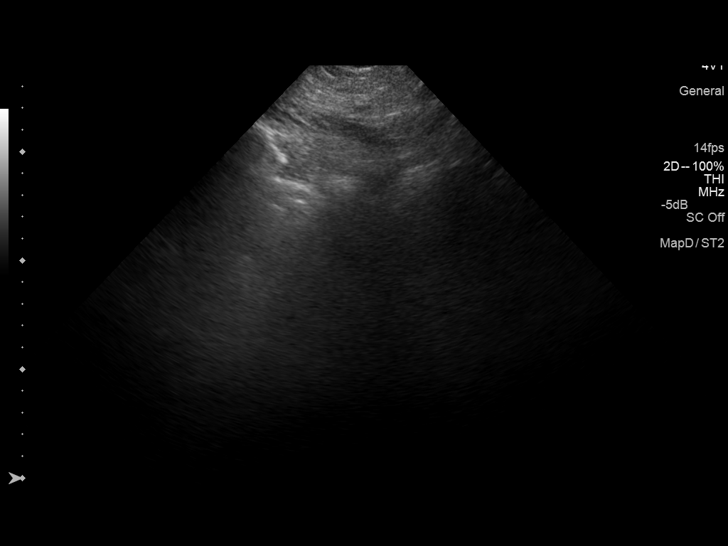
[im 13/24]
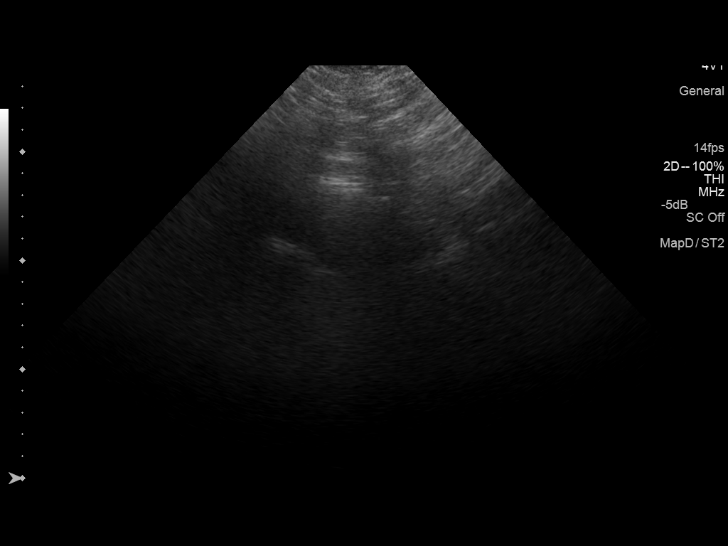
[im 15/24]
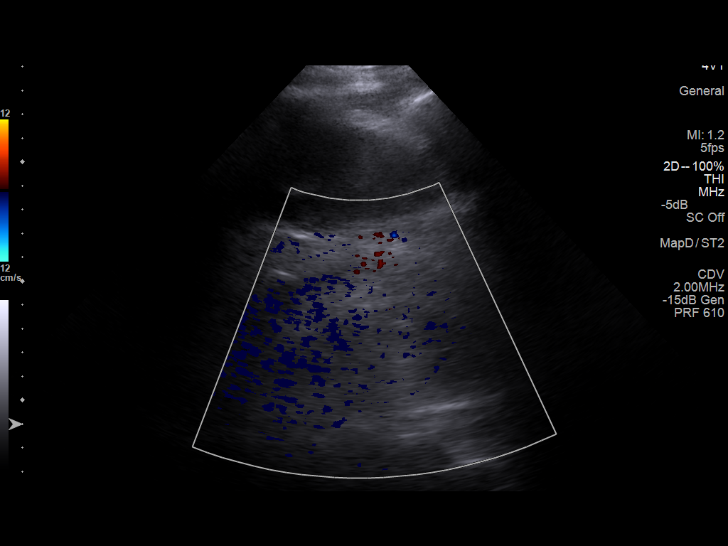
[im 17/24]
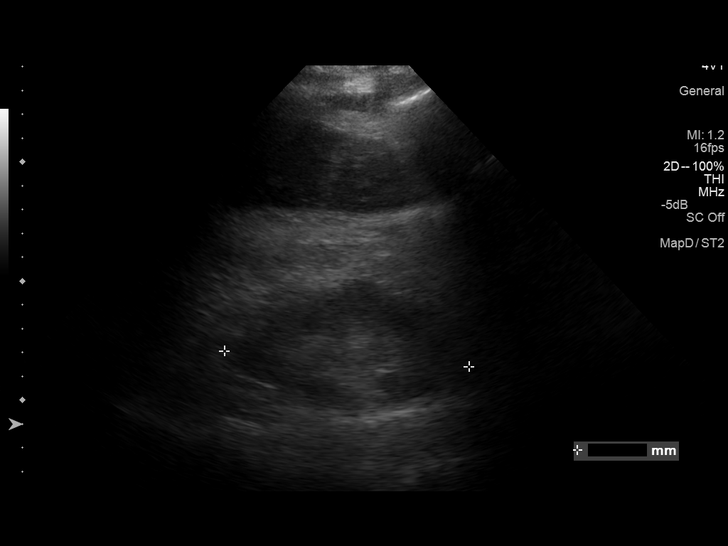
[im 19/24]
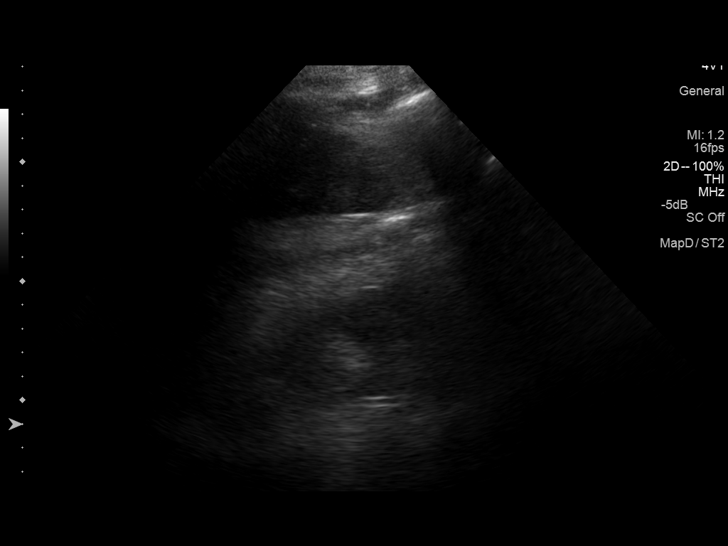
[im 20/24]
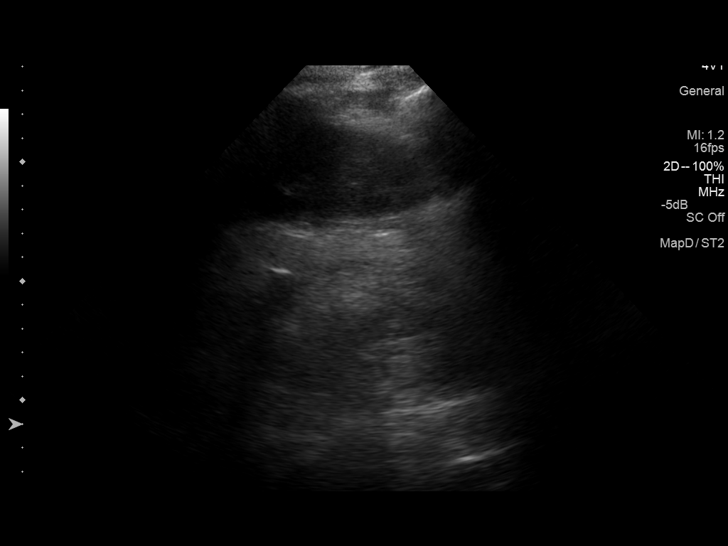
[im 22/24]
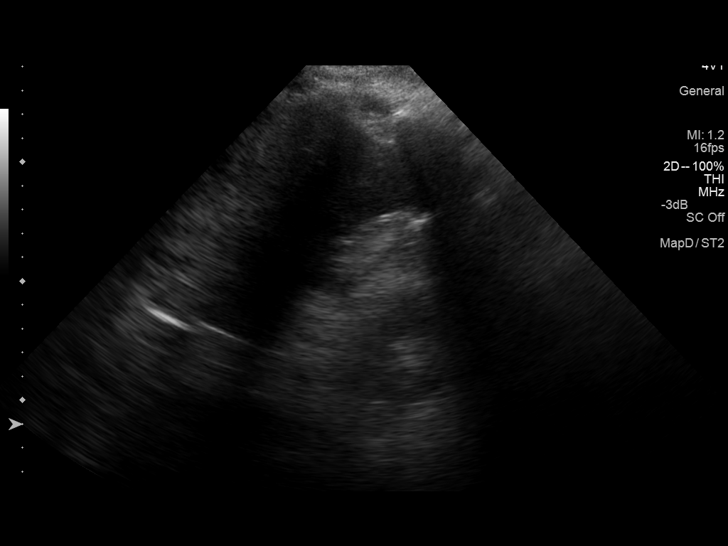
[im 24/24]
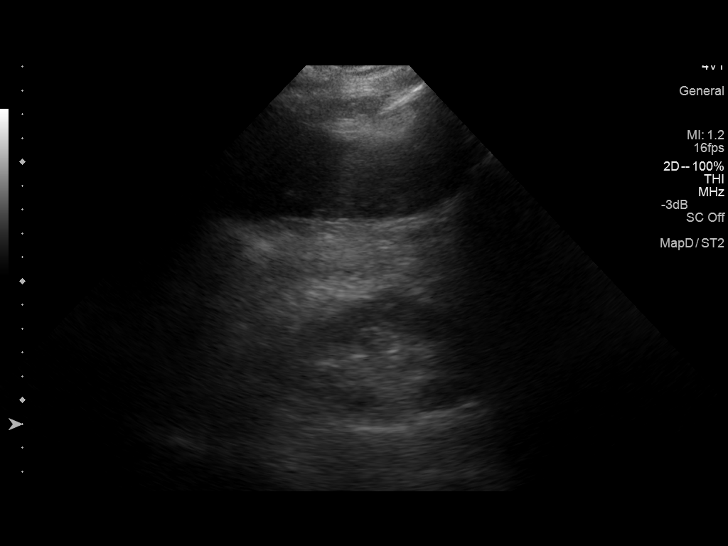

[14 of 24 positions shown; findings below may reference images not displayed]

FINDINGS: Right Kidney:

Length: 1.7 cm. Cortical thinning without hydronephrosis or focal
mass lesion.

Left Kidney:

Length: 10.3 cm. Cortical thinning without hydronephrosis or focal
mass lesion.

Bladder:

Urinary bladder is decompressed by a Foley catheter.

Nodular echotexture within the liver is compatible patient's known
metastatic disease. Small volume intraperitoneal free fluid noted.
IMPRESSION: No evidence for hydronephrosis.

## 2016-08-10 IMAGING — CR DG CHEST 1V PORT
1 series · 1 of 1 positions shown · non-contrast
Comparison: 10/02/2014

CLINICAL DATA: Hypotension

EXAM:
PORTABLE CHEST - 1 VIEW

[AP]
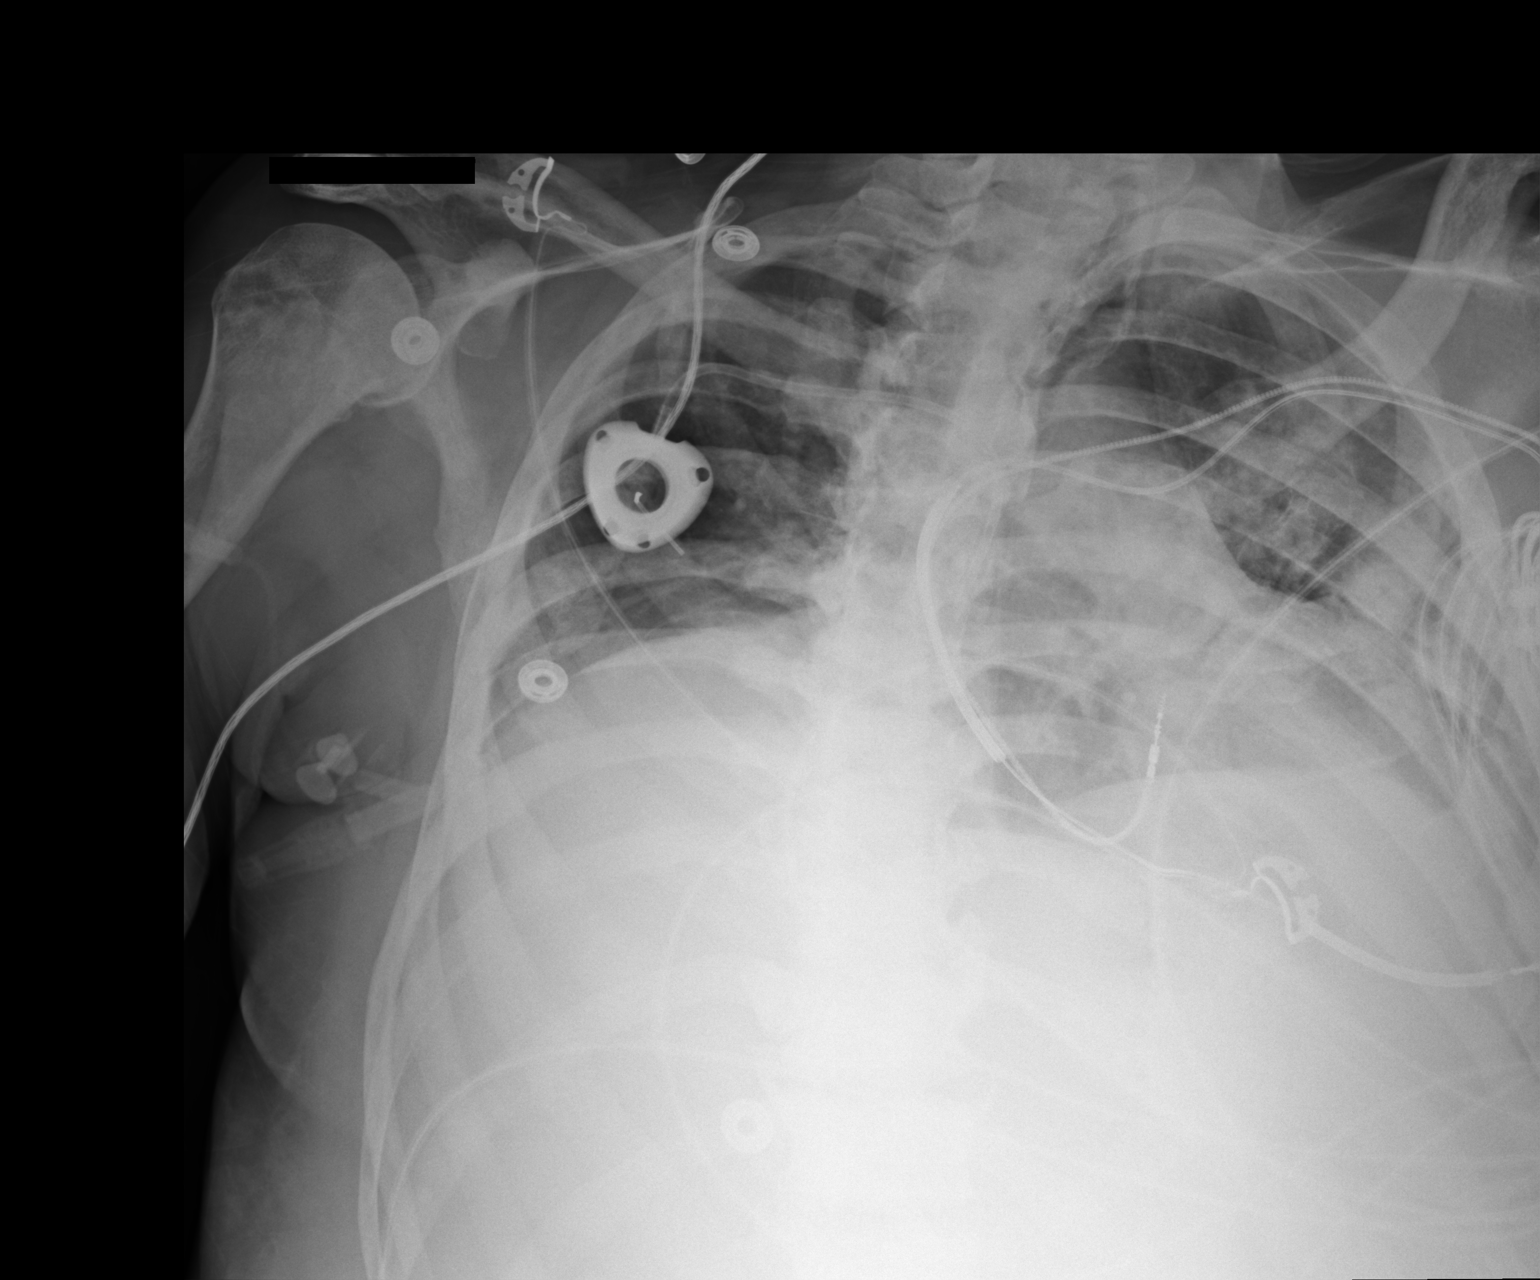

[1 of 1 positions shown; findings below may reference images not displayed]

FINDINGS: Left subclavian AICD device and leads are stable. Right subclavian
Port-A-Cath is stable. Low lung volumes with worsening bibasilar
atelectasis. No pneumothorax. Sclerotic skeleton unchanged.
IMPRESSION: Worsening bibasilar atelectasis.

## 2016-08-11 IMAGING — CR DG CHEST 1V PORT
1 series · 1 of 1 positions shown · non-contrast
Comparison: Portable exam 3727 hr compared to 10/03/2014

CLINICAL DATA: Dyspnea and cough question aspiration, personal
history hypertension, coronary artery disease, ischemic
cardiomyopathy, ventricular tachycardia at presentation, shock,
chronic CHF, renal insufficiency, metastatic prostate cancer

EXAM:
PORTABLE CHEST - 1 VIEW

[portable]
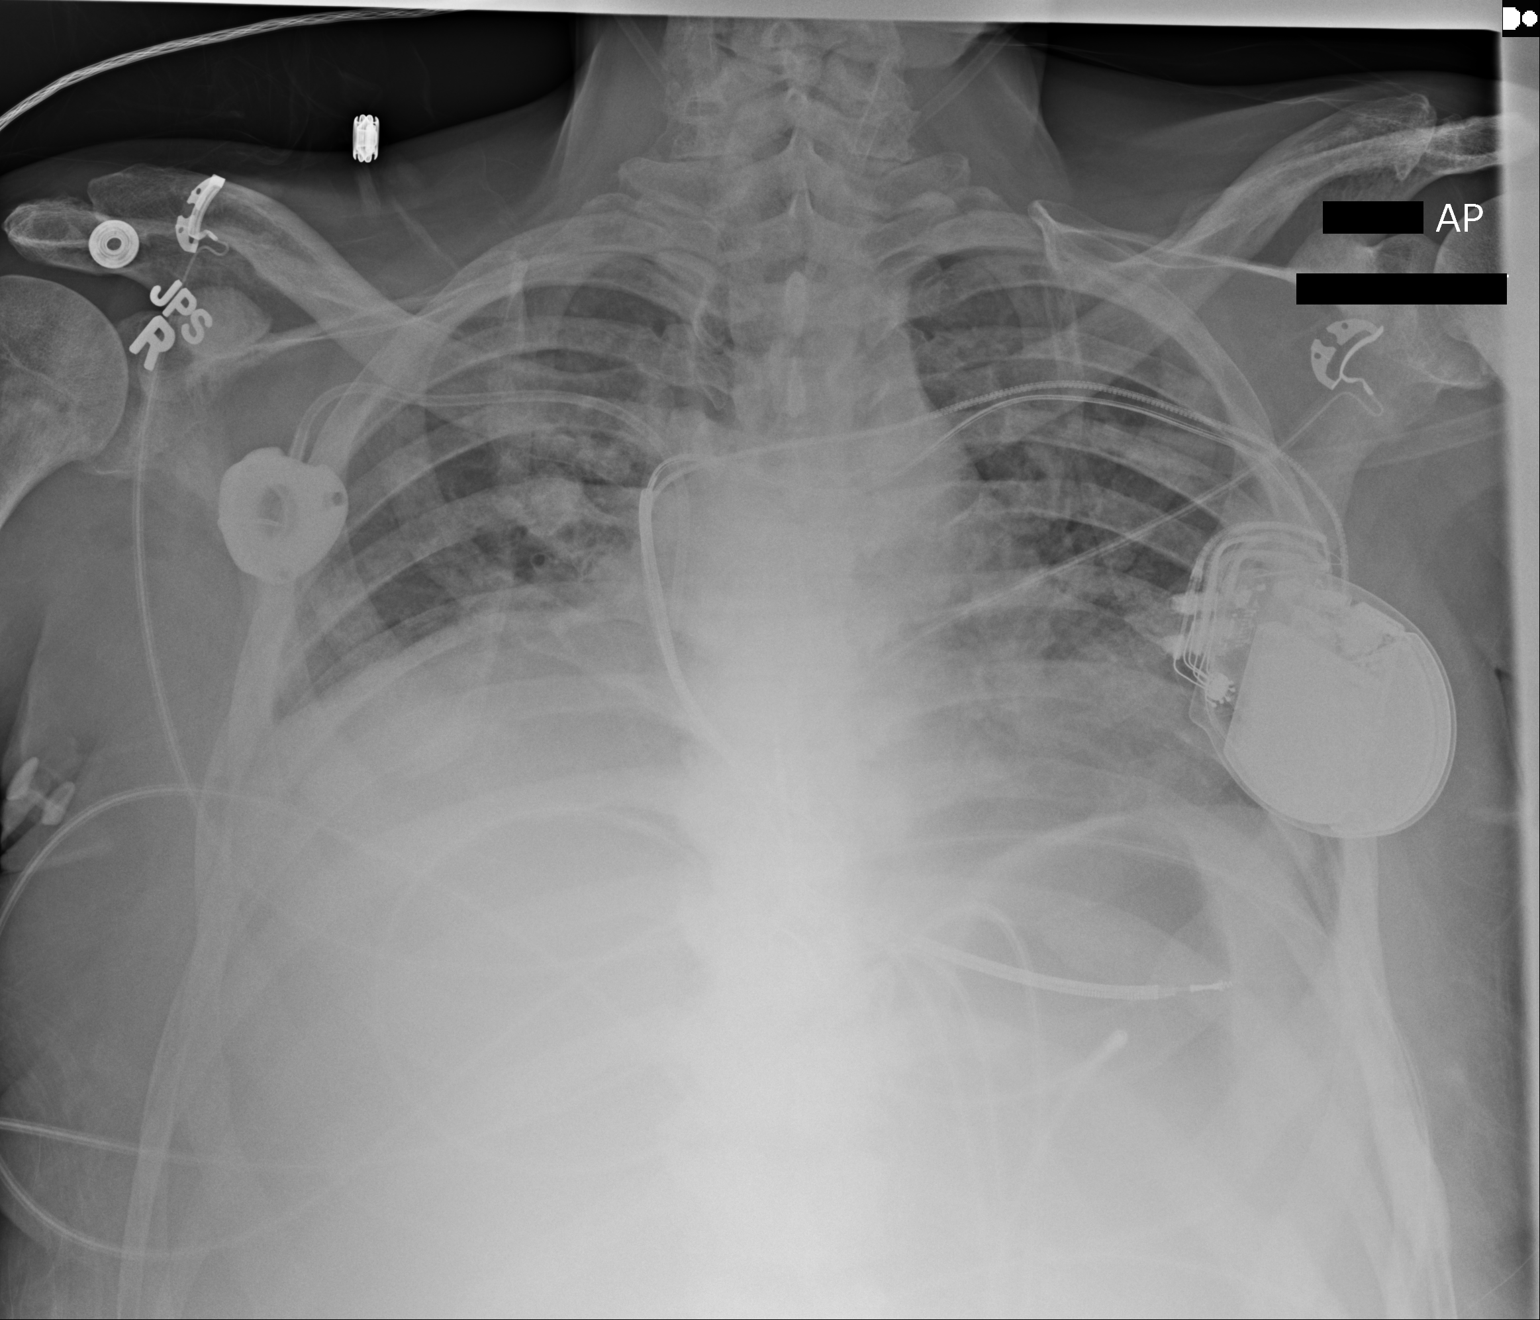

[1 of 1 positions shown; findings below may reference images not displayed]

FINDINGS: RIGHT subclavian Port-A-Cath with tip projecting over SVC.

LEFT subclavian AICD leads project over RIGHT atrium and RIGHT
ventricle.

Very low lung volumes with elevation of RIGHT diaphragm.

Enlargement of cardiac silhouette with pulmonary vascular
congestion.

Mild RIGHT basilar atelectasis.

No gross focal consolidation or pneumothorax.
IMPRESSION: Very low lung volumes with bibasilar atelectasis.
